# Patient Record
Sex: Male | Born: 1995 | Race: White | Hispanic: No | Marital: Single | Smoking: Never smoker
Health system: Southern US, Community
[De-identification: ages and names within clinical notes are randomized; demographics above are authoritative.]

## PROBLEM LIST (undated history)

## (undated) DIAGNOSIS — F909 Attention-deficit hyperactivity disorder, unspecified type: Secondary | ICD-10-CM

## (undated) DIAGNOSIS — J45909 Unspecified asthma, uncomplicated: Secondary | ICD-10-CM

## (undated) DIAGNOSIS — F319 Bipolar disorder, unspecified: Secondary | ICD-10-CM

## (undated) DIAGNOSIS — A4902 Methicillin resistant Staphylococcus aureus infection, unspecified site: Secondary | ICD-10-CM

## (undated) DIAGNOSIS — F419 Anxiety disorder, unspecified: Secondary | ICD-10-CM

## (undated) DIAGNOSIS — T7840XA Allergy, unspecified, initial encounter: Secondary | ICD-10-CM

## (undated) DIAGNOSIS — R079 Chest pain, unspecified: Secondary | ICD-10-CM

## (undated) HISTORY — PX: TYMPANOSTOMY TUBE PLACEMENT: SHX32

## (undated) HISTORY — DX: Unspecified asthma, uncomplicated: J45.909

## (undated) HISTORY — DX: Methicillin resistant Staphylococcus aureus infection, unspecified site: A49.02

## (undated) HISTORY — DX: Allergy, unspecified, initial encounter: T78.40XA

## (undated) HISTORY — DX: Chest pain, unspecified: R07.9

## (undated) HISTORY — DX: Anxiety disorder, unspecified: F41.9

## (undated) HISTORY — PX: ADENOIDECTOMY: SUR15

## (undated) HISTORY — DX: Attention-deficit hyperactivity disorder, unspecified type: F90.9

---
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
## 2013-10-22 ENCOUNTER — Emergency Department (HOSPITAL_COMMUNITY)
Admission: EM | Admit: 2013-10-22 | Discharge: 2013-10-22 | Disposition: A | Discharge status: Home / Self Care | Payer: 59 | Attending: Emergency Medicine | Admitting: Emergency Medicine

## 2013-10-22 ENCOUNTER — Emergency Department (HOSPITAL_COMMUNITY): Payer: 59

## 2013-10-22 ENCOUNTER — Encounter (HOSPITAL_COMMUNITY): Payer: Self-pay | Admitting: Emergency Medicine

## 2013-10-22 DIAGNOSIS — Z8659 Personal history of other mental and behavioral disorders: Secondary | ICD-10-CM | POA: Insufficient documentation

## 2013-10-22 DIAGNOSIS — R079 Chest pain, unspecified: Secondary | ICD-10-CM

## 2013-10-22 DIAGNOSIS — R000 Tachycardia, unspecified: Secondary | ICD-10-CM | POA: Insufficient documentation

## 2013-10-22 HISTORY — DX: Bipolar disorder, unspecified: F31.9

## 2013-10-22 LAB — CBC
HEMATOCRIT: 42.6 % (ref 39.0–52.0)
Hemoglobin: 13.8 g/dL (ref 13.0–17.0)
MCH: 31.4 pg (ref 26.0–34.0)
MCHC: 32.4 g/dL (ref 30.0–36.0)
MCV: 97 fL (ref 78.0–100.0)
PLATELETS: 254 10*3/uL (ref 150–400)
RBC: 4.39 MIL/uL (ref 4.22–5.81)
RDW: 13.1 % (ref 11.5–15.5)
WBC: 9.6 10*3/uL (ref 4.0–10.5)

## 2013-10-22 LAB — BASIC METABOLIC PANEL
BUN: 17 mg/dL (ref 6–23)
CHLORIDE: 103 meq/L (ref 96–112)
CO2: 26 meq/L (ref 19–32)
CREATININE: 1.2 mg/dL (ref 0.50–1.35)
Calcium: 10.3 mg/dL (ref 8.4–10.5)
GFR calc non Af Amer: 87 mL/min — ABNORMAL LOW (ref >90)
Glucose, Bld: 89 mg/dL (ref 70–99)
Potassium: 3.9 mEq/L (ref 3.7–5.3)
SODIUM: 141 meq/L (ref 137–147)

## 2013-10-22 LAB — I-STAT TROPONIN, ED
TROPONIN I, POC: 0 ng/mL (ref 0.00–0.08)
Troponin i, poc: 0.01 ng/mL (ref 0.00–0.08)

## 2013-10-22 MED ORDER — IBUPROFEN 800 MG PO TABS
800.0000 mg | ORAL_TABLET | Freq: Once | ORAL | Status: AC
  Administered 2013-10-22: 800 mg via ORAL
  Filled 2013-10-22: qty 1

## 2013-10-22 MED ORDER — IBUPROFEN 800 MG PO TABS
800.0000 mg | ORAL_TABLET | Freq: Once | ORAL | Status: DC
  Filled 2013-10-22: qty 1

## 2013-10-22 NOTE — Discharge Instructions (Signed)
Chest Pain (Nonspecific) °It is often hard to give a specific diagnosis for the cause of chest pain. There is always a chance that your pain could be related to something serious, such as a heart attack or a blood clot in the lungs. You need to follow up with your caregiver for further evaluation. °CAUSES  °· Heartburn. °· Pneumonia or bronchitis. °· Anxiety or stress. °· Inflammation around your heart (pericarditis) or lung (pleuritis or pleurisy). °· A blood clot in the lung. °· A collapsed lung (pneumothorax). It can develop suddenly on its own (spontaneous pneumothorax) or from injury (trauma) to the chest. °· Shingles infection (herpes zoster virus). °The chest wall is composed of bones, muscles, and cartilage. Any of these can be the source of the pain. °· The bones can be bruised by injury. °· The muscles or cartilage can be strained by coughing or overwork. °· The cartilage can be affected by inflammation and become sore (costochondritis). °DIAGNOSIS  °Lab tests or other studies, such as X-rays, electrocardiography, stress testing, or cardiac imaging, may be needed to find the cause of your pain.  °TREATMENT  °· Treatment depends on what may be causing your chest pain. Treatment may include: °· Acid blockers for heartburn. °· Anti-inflammatory medicine. °· Pain medicine for inflammatory conditions. °· Antibiotics if an infection is present. °· You may be advised to change lifestyle habits. This includes stopping smoking and avoiding alcohol, caffeine, and chocolate. °· You may be advised to keep your head raised (elevated) when sleeping. This reduces the chance of acid going backward from your stomach into your esophagus. °· Most of the time, nonspecific chest pain will improve within 2 to 3 days with rest and mild pain medicine. °HOME CARE INSTRUCTIONS  °· If antibiotics were prescribed, take your antibiotics as directed. Finish them even if you start to feel better. °· For the next few days, avoid physical  activities that bring on chest pain. Continue physical activities as directed. °· Do not smoke. °· Avoid drinking alcohol. °· Only take over-the-counter or prescription medicine for pain, discomfort, or fever as directed by your caregiver. °· Follow your caregiver's suggestions for further testing if your chest pain does not go away. °· Keep any follow-up appointments you made. If you do not go to an appointment, you could develop lasting (chronic) problems with pain. If there is any problem keeping an appointment, you must call to reschedule. °SEEK MEDICAL CARE IF:  °· You think you are having problems from the medicine you are taking. Read your medicine instructions carefully. °· Your chest pain does not go away, even after treatment. °· You develop a rash with blisters on your chest. °SEEK IMMEDIATE MEDICAL CARE IF:  °· You have increased chest pain or pain that spreads to your arm, neck, jaw, back, or abdomen. °· You develop shortness of breath, an increasing cough, or you are coughing up blood. °· You have severe back or abdominal pain, feel nauseous, or vomit. °· You develop severe weakness, fainting, or chills. °· You have a fever. °THIS IS AN EMERGENCY. Do not wait to see if the pain will go away. Get medical help at once. Call your local emergency services (911 in [place] Do not drive yourself to the hospital. °MAKE SURE YOU:  °· Understand these instructions. °· Will watch your condition. °· Will get help right away if you are not doing well or get worse. °Document Released: 03/28/2005 Document Revised: 09/10/2011 Document Reviewed: 01/22/2008 °ExitCare® Patient Information ©2014 [employer]   [employer]. ° °

## 2013-10-22 NOTE — ED Provider Notes (Signed)
CSN: [id number]     Arrival date & time 10/22/13  1746 History   First MD Initiated Contact with Patient 10/22/13 2118     Chief Complaint  Patient presents with  . Tachycardia  . Chest Pain     (Consider location/radiation/quality/duration/timing/severity/associated sxs/prior Treatment) Patient is a 18 y.o. male presenting with chest pain. The history is provided by the patient.  Chest Pain Pain location:  L lateral chest Pain quality: pressure   Pain radiates to:  Does not radiate Pain radiates to the back: no   Pain severity:  Moderate Onset quality:  Gradual Timing:  Intermittent Progression:  Worsening Chronicity:  New Context comment:  Running Relieved by:  Rest Worsened by:  Nothing tried Ineffective treatments:  None tried Associated symptoms: no abdominal pain, no cough, no fever, no shortness of breath and not vomiting     Past Medical History  Diagnosis Date  . Bipolar 1 disorder    Past Surgical History  Procedure Laterality Date  . Knee surgery Right    History reviewed. No pertinent family history. History  Substance Use Topics  . Smoking status: Never Smoker   . Smokeless tobacco: Not on file  . Alcohol Use: No    Review of Systems  Constitutional: Negative for fever.  Respiratory: Negative for cough and shortness of breath.   Cardiovascular: Positive for chest pain.  Gastrointestinal: Negative for vomiting and abdominal pain.  All other systems reviewed and are negative.     Allergies  Review of patient's allergies indicates no known allergies.  Home Medications   Prior to Admission medications   Not on File   BP 137/47  Pulse 60  Temp(Src) 98.1 F (36.7 C) (Oral)  Resp 15  SpO2 100% Physical Exam  Nursing note and vitals reviewed. Constitutional: He is oriented to person, place, and time. He appears well-developed and well-nourished. No distress.  HENT:  Head: Normocephalic and atraumatic.  Mouth/Throat: No oropharyngeal  exudate.  Eyes: EOM are normal. Pupils are equal, round, and reactive to light.  Neck: Normal range of motion. Neck supple.  Cardiovascular: Normal rate and regular rhythm.  Exam reveals no friction rub.   No murmur heard. Pulmonary/Chest: Effort normal and breath sounds normal. No respiratory distress. He has no wheezes. He has no rales. He exhibits no tenderness.  Abdominal: Soft. He exhibits no distension. There is no tenderness. There is no rebound.  Musculoskeletal: Normal range of motion. He exhibits no edema.  Neurological: He is alert and oriented to person, place, and time. No cranial nerve deficit. He exhibits normal muscle tone. Coordination normal.  Skin: No rash noted. He is not diaphoretic.    ED Course  Procedures (including critical care time) Labs Review Labs Reviewed  BASIC METABOLIC PANEL - Abnormal; Notable for the following:    GFR calc non Af Amer 87 (*)    All other components within normal limits  CBC  I-STAT TROPOININ, ED  Rosezena SensorI-STAT TROPOININ, ED    Imaging Review Dg Chest 2 View  10/22/2013   CLINICAL DATA:  Tachycardia.  Chest pain.  EXAM: CHEST  2 VIEW  COMPARISON:  None.  FINDINGS: The heart size and mediastinal contours are within normal limits. Both lungs are clear. The visualized skeletal structures are unremarkable.  IMPRESSION: No active cardiopulmonary disease.   Electronically Signed   By: Maisie Fushomas  Register   On: 10/22/2013 20:25     EKG Interpretation   Date/Time:  Thursday October 22 2013 17:54:20 EDT Ventricular Rate:  91 PR Interval:  158 QRS Duration: 86 QT Interval:  342 QTC Calculation: 420 R Axis:   80 Text Interpretation:  Normal sinus rhythm Normal ECG Similar to prior  Confirmed by Gwendolyn GrantWALDEN  MD, Nagi Furio ([id number]) on 10/22/2013 9:18:37 PM      MDM   Final diagnoses:  Chest pain    64M with no prior hx presents with chest pain. Ongoing for months, worse today while running on the treadmill. Patient had HR at that time in the 210s per  the treadmill. Elevated BP and HR at multiple other checks during the day.  Today, pinpoint pain, 2 places, L lateral chest. No radiation. No SOB, nausea, vomiting, diarrhea. No family history of early cardiac disease. Here, vitals stable, lungs clear, EKG ok, no signs of brugada, intervals normal, no signs of ischemia.  Serial troponins normal, CXR normal.  Doubt HR actually in 210s - as came from treadmill heart rate monitor. With young age and possibility of elevated HR, will have him f/u with Cards. I spoke with the fellow, who will arrange f/u. Stable for discharge.  Dagmar HaitWilliam Blayne Frankie, MD 10/23/13 [phone]

## 2013-10-22 NOTE — ED Notes (Addendum)
Pt reports on treadmill and noted HR to be 220. States "it is normally in the 190's when I work out. And my BP has been high." Pt also reports left sided chest pressure with exercise. Denies at this time. Also reports increased SOB with ambulation x "several months." Reports hx bipolar, states "I just get myself worked up over this kind of stuff." Pt sent here by baseball physical therapist for check up.

## 2013-11-19 ENCOUNTER — Encounter: Payer: Self-pay | Admitting: Cardiovascular Disease

## 2013-11-19 ENCOUNTER — Encounter: Payer: Self-pay | Admitting: *Deleted

## 2013-11-19 DIAGNOSIS — R079 Chest pain, unspecified: Secondary | ICD-10-CM | POA: Insufficient documentation

## 2013-11-20 ENCOUNTER — Encounter: Payer: Self-pay | Admitting: Cardiovascular Disease

## 2013-11-20 ENCOUNTER — Ambulatory Visit (INDEPENDENT_AMBULATORY_CARE_PROVIDER_SITE_OTHER): Payer: 59 | Admitting: Cardiovascular Disease

## 2013-11-20 VITALS — BP 132/80 | HR 61 | Ht 69.0 in | Wt 180.0 lb

## 2013-11-20 DIAGNOSIS — R079 Chest pain, unspecified: Secondary | ICD-10-CM

## 2013-11-20 DIAGNOSIS — R0989 Other specified symptoms and signs involving the circulatory and respiratory systems: Secondary | ICD-10-CM

## 2013-11-20 DIAGNOSIS — R0600 Dyspnea, unspecified: Secondary | ICD-10-CM

## 2013-11-20 DIAGNOSIS — R0609 Other forms of dyspnea: Secondary | ICD-10-CM

## 2013-11-20 MED ORDER — ALBUTEROL SULFATE HFA 108 (90 BASE) MCG/ACT IN AERS: 2.0000 | INHALATION_SPRAY | Freq: Four times a day (QID) | RESPIRATORY_TRACT | Status: DC | PRN

## 2013-11-20 NOTE — Assessment & Plan Note (Signed)
Atypical  Persistent post ER visit  Normal exam and ECG  F/U stress echo given dyspnea as well

## 2013-11-20 NOTE — Progress Notes (Signed)
Patient ID: Thomas Burton, male   DOB: Jan 28, 1996, 18 y.o.   MRN: [id number]  18 yo referred from ER for chest pain Seen there 4/23 with negative w/u  Normal ECG (reviewed)  Normal enzymes  CXR with NAD Pain on left side of chest Sharp partially reproducible pain.  He has kyphosis and has been seeing rehab at [facility].  Pitches at [facility] and has some shoulder weakness Also has some exertional dyspnea.  No history of asthma but breathing worse with exertion  Non smoker but a lot of 2nd hand smoke.  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  Still getting pains in chest Not pleuritic  No couging sputum or fever.     ROS: Denies fever, malais, weight loss, blurry vision, decreased visual acuity, cough, sputum, SOB, hemoptysis, pleuritic pain, palpitaitons, heartburn, abdominal pain, melena, lower extremity edema, claudication, or rash.  All other systems reviewed and negative   General: Affect appropriate Healthy:  appears stated age  Some upper lumbar kyphosis  HEENT: normal Neck supple with no adenopathy JVP normal no bruits no thyromegaly Lungs clear with no wheezing and good diaphragmatic motion Heart:  S1/S2 no murmur,rub, gallop or click PMI normal Abdomen: benighn, BS positve, no tenderness, no AAA no bruit.  No HSM or HJR Distal pulses intact with no bruits No edema Neuro non-focal Skin warm and dry No muscular weakness  Medications No current outpatient prescriptions on file.   No current facility-administered medications for this visit.    Allergies Review of patient's allergies indicates not on file.  Family History: No family history on file.  Social History: History   Social History  . Marital Status: Married    Spouse Name: N/A    Number of Children: N/A  . Years of Education: N/A   Occupational History  . Not on file.   Social History Main Topics  . Smoking status: Never Smoker   . Smokeless tobacco: Not on file    . Alcohol Use: No  . Drug Use: No  . Sexual Activity: Not on file   Other Topics Concern  . Not on file   Social History Narrative  . No narrative on file    Electrocardiogram:  NSR normal ECG   Assessment and Plan

## 2013-11-20 NOTE — Patient Instructions (Signed)
Your physician recommends that you schedule a follow-up appointment in:  AS NEEDED  Your physician has recommended you make the following change in your medication: ADD ALBUTEROL INHALER   AS  NEEDED Your physician has requested that you have a stress echocardiogram. For further information please visit [email or web address] Please follow instruction sheet as given. Your physician has recommended that you have a pulmonary function test. Pulmonary Function Tests are a group of tests that measure how well air moves in and out of your lungs.

## 2013-11-20 NOTE — Assessment & Plan Note (Signed)
CXR and exam ok  May have exercise induced asthma  F/U PFTls  Trial of Albuterol  F/U primary

## 2013-11-24 ENCOUNTER — Telehealth: Payer: Self-pay | Admitting: Cardiovascular Disease

## 2013-11-24 NOTE — Telephone Encounter (Signed)
New Message  Pt was recently seen and was given clearance to resume regular sports activities. Pt request to have a written clearance. Please call back to discuss//SR

## 2013-11-24 NOTE — Telephone Encounter (Signed)
LMTCB ./CY 

## 2013-11-25 NOTE — Telephone Encounter (Signed)
Follow up ° ° ° ° ° °Returning Thomas Burton's call °

## 2013-11-26 ENCOUNTER — Encounter: Payer: Self-pay | Admitting: *Deleted

## 2013-11-26 NOTE — Telephone Encounter (Signed)
Spoke with pt mother, they had caught dr Eden Emms in the hallway after the appt and he had given the pt permission to go ahead and exercise prior to him having the stress test. The coach needs a note. Will forward for dr Eden Emms review. Not dictated in office note. Will contact them once note complete.

## 2013-11-26 NOTE — Telephone Encounter (Signed)
Ok to write note for him to exercise

## 2013-11-26 NOTE — Telephone Encounter (Signed)
Spoke with pt mother, letter generated and placed at the front desk for pick up

## 2013-11-26 NOTE — Telephone Encounter (Signed)
Follow up     Returning Thomas Burton's call regarding son

## 2013-12-15 ENCOUNTER — Other Ambulatory Visit (HOSPITAL_COMMUNITY): Payer: 59

## 2013-12-23 ENCOUNTER — Ambulatory Visit (INDEPENDENT_AMBULATORY_CARE_PROVIDER_SITE_OTHER): Payer: 59 | Admitting: Internal Medicine

## 2013-12-23 ENCOUNTER — Encounter (INDEPENDENT_AMBULATORY_CARE_PROVIDER_SITE_OTHER): Payer: Self-pay

## 2013-12-23 DIAGNOSIS — R079 Chest pain, unspecified: Secondary | ICD-10-CM

## 2013-12-23 DIAGNOSIS — R0600 Dyspnea, unspecified: Secondary | ICD-10-CM

## 2013-12-23 DIAGNOSIS — R0989 Other specified symptoms and signs involving the circulatory and respiratory systems: Secondary | ICD-10-CM

## 2013-12-23 DIAGNOSIS — R0609 Other forms of dyspnea: Secondary | ICD-10-CM

## 2013-12-23 LAB — PULMONARY FUNCTION TEST
DL/VA % pred: 124 %
DL/VA: 5.7 ml/min/mmHg/L
DLCO unc % pred: 126 %
DLCO unc: 36.68 ml/min/mmHg
FEF 25-75 Post: 4.98 L/sec
FEF 25-75 Pre: 3.94 L/sec
FEF2575-%CHANGE-POST: 26 %
FEF2575-%PRED-POST: 105 %
FEF2575-%Pred-Pre: 83 %
FEV1-%CHANGE-POST: 10 %
FEV1-%PRED-POST: 109 %
FEV1-%PRED-PRE: 98 %
FEV1-PRE: 4.28 L
FEV1-Post: 4.74 L
FEV1FVC-%CHANGE-POST: 5 %
FEV1FVC-%PRED-PRE: 93 %
FEV6-%Change-Post: 4 %
FEV6-%Pred-Post: 110 %
FEV6-%Pred-Pre: 105 %
FEV6-PRE: 5.45 L
FEV6-Post: 5.71 L
FEV6FVC-%Change-Post: 0 %
FEV6FVC-%PRED-PRE: 99 %
FEV6FVC-%Pred-Post: 99 %
FVC-%Change-Post: 4 %
FVC-%Pred-Post: 110 %
FVC-%Pred-Pre: 105 %
FVC-POST: 5.71 L
FVC-Pre: 5.46 L
POST FEV1/FVC RATIO: 83 %
POST FEV6/FVC RATIO: 100 %
PRE FEV6/FVC RATIO: 100 %
Pre FEV1/FVC ratio: 79 %
RV % PRED: 124 %
RV: 1.58 L
TLC % PRED: 106 %
TLC: 6.75 L

## 2013-12-23 NOTE — Progress Notes (Signed)
PFT done today. 

## 2014-02-14 ENCOUNTER — Ambulatory Visit (INDEPENDENT_AMBULATORY_CARE_PROVIDER_SITE_OTHER): Payer: 59 | Admitting: Family Medicine

## 2014-02-14 VITALS — BP 136/82 | HR 72 | Temp 97.9°F | Resp 16 | Ht 69.5 in | Wt 175.2 lb

## 2014-02-14 DIAGNOSIS — Z113 Encounter for screening for infections with a predominantly sexual mode of transmission: Secondary | ICD-10-CM

## 2014-02-14 DIAGNOSIS — Z23 Encounter for immunization: Secondary | ICD-10-CM

## 2014-02-14 DIAGNOSIS — Z0000 Encounter for general adult medical examination without abnormal findings: Secondary | ICD-10-CM

## 2014-02-14 LAB — POCT URINALYSIS DIPSTICK
BILIRUBIN UA: NEGATIVE
Blood, UA: NEGATIVE
GLUCOSE UA: NEGATIVE
KETONES UA: NEGATIVE
Leukocytes, UA: NEGATIVE
Nitrite, UA: NEGATIVE
Protein, UA: NEGATIVE
SPEC GRAV UA: 1.02
UROBILINOGEN UA: 0.2
pH, UA: 6
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
## 2014-02-14 NOTE — Patient Instructions (Addendum)
1. RETURN IN TWO MONTHS FOR GARDISIL/HPV#2 2.  RETURN IN SIX MONTHS FOR HEPATITIS A#2 AND GARDISIL #3.   Meningococcal Vaccines: What You Need to Know 1. What is meningococcal disease? Meningococcal disease is a serious bacterial illness. It is a leading cause of bacterial meningitis in children 2 through 20 years old in the [place]. Meningitis is an infection of the covering of the brain and the spinal cord. Meningococcal disease also causes blood infections. About 1,000-1,200 people get meningococcal disease each year in the [place] Even when they are treated with antibiotics, 10-15% of these people die. Of those who live, another 11%-19% lose their arms or legs, have problems with their nervous systems, become deaf, or suffer seizures or strokes. Anyone can get meningococcal disease. But it is most common in infants less than one year of age and people 16-21 years. Children with certain medical conditions, such as lack of a spleen, have an increased risk of getting meningococcal disease. College freshmen living in dorms are also at increased risk. Meningococcal infections can be treated with drugs such as penicillin. Still, many people who get the disease die from it, and many others are affected for life. This is why preventing the disease through use of meningococcal vaccine is important for people at highest risk. 2. Meningococcal vaccine There are two kinds of meningococcal vaccine in the [place]  Meningococcal conjugate vaccine (MCV4) is the preferred vaccine for people 67 years of age and younger.  Meningococcal polysaccharide vaccine (MPSV4) has been available since the 1970s. It is the only meningococcal vaccine licensed for people older than 55. Both vaccines can prevent 4 types of meningococcal disease, including 2 of the 3 types most common in the [place] and a type that causes epidemics in [place]. There are other types of meningococcal disease; the vaccines do not protect  against these.  3. Who should get meningococcal vaccine and when? Routine vaccination Two doses of MCV4 are recommended for adolescents 11 through 20 years of age: the first dose at 37 or 20 years of age, with a booster dose at age 78. Adolescents in this age group with HIV infection should get 3 doses: 2 doses 2 months apart at 21 or 12 years, plus a booster at age 52. If the first dose (or series) is given between 101 and 70 years of age, the booster should be given between 27 and 21. If the first dose (or series) is given after the 16th birthday, a booster is not needed. Other people at increased risk  College freshmen living in dormitories.  Laboratory personnel who are routinely exposed to meningococcal bacteria.  [employer] recruits.  Anyone traveling to, or living in, a part of the world where meningococcal disease is common, such as parts of [place].  Anyone who has a damaged spleen, or whose spleen has been removed.  Anyone who has persistent complement component deficiency (an immune system disorder).  People who might have been exposed to meningitis during an outbreak. Children between 26 and 41 months of age, and anyone else with certain medical conditions need 2 doses for adequate protection. Ask your doctor about the number and timing of doses, and the need for booster doses. MCV4 is the preferred vaccine for people in these groups who are 9 months through 20 years of age. MPSV4 can be used for adults older than 55. 4. Some people should not get meningococcal vaccine or should wait.  Anyone who has ever had a severe (life-threatening) allergic  reaction to a previous dose of MCV4 or MPSV4 vaccine should not get another dose of either vaccine.  Anyone who has a severe (life threatening) allergy to any vaccine component should not get the vaccine. Tell your doctor if you have any severe allergies.  Anyone who is moderately or severely ill at the time the shot is scheduled  should probably wait until they recover. Ask your doctor. People with a mild illness can usually get the vaccine.  Meningococcal vaccines may be given to pregnant women. MCV4 is a fairly new vaccine and has not been studied in pregnant women as much as MPSV4 has. It should be used only if clearly needed. The manufacturers of MCV4 maintain pregnancy registries for women who are vaccinated while pregnant. Except for children with sickle cell disease or without a working spleen, meningococcal vaccines may be given at the same time as other vaccines. 5. What are the risks from meningococcal vaccines? A vaccine, like any medicine, could possibly cause serious problems, such as severe allergic reactions. The risk of meningococcal vaccine causing serious harm, or death, is extremely small. Brief fainting spells and related symptoms (such as jerking or seizure-like movements) can follow a vaccination. They happen most often with adolescents, and they can result in falls and injuries. Sitting or lying down for about 15 minutes after getting the shot--especially if you feel faint--can help prevent these injuries. Mild problems As many as half the people who get meningococcal vaccines have mild side effects, such as redness or pain where the shot was given. If these problems occur, they usually last for 1 or 2 days. They are more common after MCV4 than after MPSV4. A small percentage of people who receive the vaccine develop a mild fever. Severe problems Serious allergic reactions, within a few minutes to a few hours of the shot, are very rare. 6. What if there is a serious reaction? What should I look for? Look for anything that concerns you, such as signs of a severe allergic reaction, very high fever, or behavior changes. Signs of a severe allergic reaction can include hives, swelling of the face and throat, difficulty breathing, a fast heartbeat, dizziness, and weakness. These would start a few minutes to  a few hours after the vaccination. What should I do?  If you think it is a severe allergic reaction or other emergency that can't wait, call 9-1-1 or get the person to the nearest hospital. Otherwise, call your doctor.  Afterward, the reaction should be reported to the Vaccine Adverse Event Reporting System (VAERS). Your doctor might file this report, or you can do it yourself through the VAERS web site at www.vaers.LAgents.no, or by calling 1-564 657 9667. VAERS is only for reporting reactions. They do not give medical advice. 7. The National Vaccine Injury Compensation Program The [employer] Vaccine Injury Compensation Program (VICP) is a federal program that was created to compensate people who may have been injured by certain vaccines. Persons who believe they may have been injured by a vaccine can learn about the program and about filing a claim by calling 1-684 009 2407 or visiting the VICP website at SpiritualWord.at. 8. How can I learn more?  Ask your doctor.  Call your local or state health department.  Contact the Centers for Disease Control and Prevention (CDC):  Call 612-326-5228 (1-800-CDC-INFO) or  Visit the CDC's website at PicCapture.uy CDC Meningococcal Vaccine (Interim) VIS (04/14/2010) Document Released: 04/15/2006 Document Revised: 11/02/2013 Document Reviewed: 10/08/2012 [facility] Patient Information 2015 [place], [place]. This information is  not intended to replace advice given to you by your health care provider. Make sure you discuss any questions you have with your health care provider.   HPV Vaccine Gardasil (Human Papillomavirus): What You Need to Know 1. What is HPV? Genital human papillomavirus (HPV) is the most common sexually transmitted virus in the [place]. More than half of sexually active men and women are infected with HPV at some time in their lives. About 20 million Americans are currently infected, and about 6 million more  get infected each year. HPV is usually spread through sexual contact. Most HPV infections don't cause any symptoms, and go away on their own. But HPV can cause cervical cancer in women. Cervical cancer is the 2nd leading cause of cancer deaths among women around the world. In the [place], about 12,000 women get cervical cancer every year and about 4,000 are expected to die from it. HPV is also associated with several less common cancers, such as vaginal and vulvar cancers in women, and anal and oropharyngeal (back of the throat, including base of tongue and tonsils) cancers in both men and women. HPV can also cause genital warts and warts in the throat. There is no cure for HPV infection, but some of the problems it causes can be treated. 2. HPV vaccine: Why get vaccinated? The HPV vaccine you are getting is one of two vaccines that can be given to prevent HPV. It may be given to both males and females.  This vaccine can prevent most cases of cervical cancer in females, if it is given before exposure to the virus. In addition, it can prevent vaginal and vulvar cancer in females, and genital warts and anal cancer in both males and females. Protection from HPV vaccine is expected to be long-lasting. But vaccination is not a substitute for cervical cancer screening. Women should still get regular Pap tests. 3. Who should get this HPV vaccine and when? HPV vaccine is given as a 3-dose series  1st Dose: Now  2nd Dose: 1 to 2 months after Dose 1  3rd Dose: 6 months after Dose 1 Additional (booster) doses are not recommended. Routine vaccination  This HPV vaccine is recommended for girls and boys 21 or 20 years of age. It may be given starting at age 6. Why is HPV vaccine recommended at 56 or 20 years of age?  HPV infection is easily acquired, even with only one sex partner. That is why it is important to get HPV vaccine before any sexual contact takes place. Also, response to the vaccine is  better at this age than at older ages. Catch-up vaccination This vaccine is recommended for the following people who have not completed the 3-dose series:   Females 13 through 20 years of age.  Males 13 through 20 years of age. This vaccine may be given to men 22 through 19 years of age who have not completed the 3-dose series. It is recommended for men through age 42 who have sex with men or whose immune system is weakened because of HIV infection, other illness, or medications.  HPV vaccine may be given at the same time as other vaccines. 4. Some people should not get HPV vaccine or should wait.  Anyone who has ever had a life-threatening allergic reaction to any component of HPV vaccine, or to a previous dose of HPV vaccine, should not get the vaccine. Tell your doctor if the person getting vaccinated has any severe allergies, including an allergy to  yeast.  HPV vaccine is not recommended for pregnant women. However, receiving HPV vaccine when pregnant is not a reason to consider terminating the pregnancy. Women who are breast feeding may get the vaccine.  People who are mildly ill when a dose of HPV is planned can still be vaccinated. People with a moderate or severe illness should wait until they are better. 5. What are the risks from this vaccine? This HPV vaccine has been used in the [place] and around the world for about six years and has been very safe. However, any medicine could possibly cause a serious problem, such as a severe allergic reaction. The risk of any vaccine causing a serious injury, or death, is extremely small. Life-threatening allergic reactions from vaccines are very rare. If they do occur, it would be within a few minutes to a few hours after the vaccination. Several mild to moderate problems are known to occur with this HPV vaccine. These do not last long and go away on their own.  Reactions in the arm where the shot was given:  Pain (about 8 people in  10)  Redness or swelling (about 1 person in 4)  Fever:  Mild (100 F) (about 1 person in 10)  Moderate (102 F) (about 1 person in 41)  Other problems:  Headache (about 1 person in 3)  Fainting: Brief fainting spells and related symptoms (such as jerking movements) can happen after any medical procedure, including vaccination. Sitting or lying down for about 15 minutes after a vaccination can help prevent fainting and injuries caused by falls. Tell your doctor if the patient feels dizzy or light-headed, or has vision changes or ringing in the ears.  Like all vaccines, HPV vaccines will continue to be monitored for unusual or severe problems. 6. What if there is a serious reaction? What should I look for?  Look for anything that concerns you, such as signs of a severe allergic reaction, very high fever, or behavior changes. Signs of a severe allergic reaction can include hives, swelling of the face and throat, difficulty breathing, a fast heartbeat, dizziness, and weakness. These would start a few minutes to a few hours after the vaccination.  What should I do?  If you think it is a severe allergic reaction or other emergency that can't wait, call 9-1-1 or get the person to the nearest hospital. Otherwise, call your doctor.  Afterward, the reaction should be reported to the Vaccine Adverse Event Reporting System (VAERS). Your doctor might file this report, or you can do it yourself through the VAERS web site at www.vaers.LAgents.no, or by calling 1-978-375-7117. VAERS is only for reporting reactions. They do not give medical advice. 7. The National Vaccine Injury Compensation Program  The [employer] Vaccine Injury Compensation Program (VICP) is a federal program that was created to compensate people who may have been injured by certain vaccines.  Persons who believe they may have been injured by a vaccine can learn about the program and about filing a claim by calling 1-(845) 664-3267 or  visiting the VICP website at SpiritualWord.at. 8. How can I learn more?  Ask your doctor.  Call your local or state health department.  Contact the Centers for Disease Control and Prevention (CDC):  Call 814-172-4335 (1-800-CDC-INFO)  or  Visit CDC's website at PicCapture.uy CDC Human Papillomavirus (HPV) Gardasil (Interim) 11/16/11 Document Released: 04/15/2006 Document Revised: 11/02/2013 Document Reviewed: 07/30/2013 ExitCare Patient Information 2015 [place], [place]. This information is not intended to replace advice given to you by  your health care provider. Make sure you discuss any questions you have with your health care provider.   Hepatitis A Vaccine: What You Need to Know 1. What is hepatitis A? Hepatitis A is a serious liver disease caused by the hepatitis A virus (HAV). HAV is found in the stool of people with hepatitis A. It is usually spread by close personal contact and sometimes by eating food or drinking water containing HAV. A person who has hepatitis A can easily pass the disease to others within the same household. Hepatitis A can cause:  "flu-like" illness  jaundice (yellow skin or eyes, dark urine)  severe stomach pains and diarrhea (children) People with hepatitis A often have to be hospitalized (up to about 1 person in 5). Adults with hepatitis A are often too ill to work for up to a month. Sometimes, people die as a result of hepatitis A (about 3-6 deaths per 1,000 cases). Hepatitis A vaccine can prevent hepatitis A. 2. Who should get hepatitis A vaccine and when? WHO Some people should be routinely vaccinated with hepatitis A vaccine:  All children between their first and second birthdays (1312 through 6423 months of age).  Anyone 1 year of age and older traveling to or working in countries with high or intermediate prevalence of hepatitis A, such as those located in [place] or [place], [place], [place]except [place], and  [place]. For more information see http://www.church.org/www.cdc.gov/travel.  Children and adolescents 2 through 20 years of age who live in states or communities where routine vaccination has been implemented because of high disease incidence.  Men who have sex with men.  People who use street drugs.  People with chronic liver disease.  People who are treated with clotting factor concentrates.  People who work with HAV-infected primates or who work with HAV in [employer] laboratories.  Members of households planning to adopt a child, or care for a newly arriving adopted child, from a country where hepatitis A is common. Other people might get hepatitis A vaccine in certain situations (ask your doctor for more details):  Unvaccinated children or adolescents in communities where outbreaks of hepatitis A are occurring.  Unvaccinated people who have been exposed to hepatitis A virus.  Anyone 1 year of age or older who wants protection from hepatitis A. Hepatitis A vaccine is not licensed for children younger than 1 year of age. WHEN For children, the first dose should be given at 8912 through 3923 months of age. Children who are not vaccinated by 192 years of age can be vaccinated at later visits. For others at risk, the hepatitis A vaccine series may be started whenever a person wishes to be protected or is at risk of infection. For travelers, it is best to start the vaccine series at least 1 month before traveling. (Some protection may still result if the vaccine is given on or closer to the travel date.) Some people who cannot get the vaccine before traveling, or for whom the vaccine might not be effective, can get a shot called immune globulin (IG). IG gives immediate, temporary protection. Two doses of the vaccine are needed for lasting protection. These doses should be given at least 6 months apart. Hepatitis A vaccine may be given at the same time as other vaccines. 3. Some people should not get hepatitis  A vaccine or should wait.  Anyone who has ever had a severe (life threatening) allergic reaction to a previous dose of hepatitis A vaccine should not  get another dose.  Anyone who has a severe (life threatening) allergy to any vaccine component should not get the vaccine.  Tell your doctor if you have any severe allergies, including a severe allergy to latex. All hepatitis A vaccines contain alum, and some hepatitis A vaccines contain 2-phenoxyethanol.  Anyone who is moderately or severely ill at the time the shot is scheduled should probably wait until they recover. Ask your doctor. People with a mild illness can usually get the vaccine.  Tell your doctor if you are pregnant. Because hepatitis A vaccine is inactivated (killed), the risk to a pregnant woman or her unborn baby is believed to be very low. But your doctor can weigh any theoretical risk from the vaccine against the need for protection. 4. What are the risks from hepatitis A vaccine? A vaccine, like any medicine, could possibly cause serious problems, such as severe allergic reactions. The risk of hepatitis A vaccine causing serious harm, or death, is extremely small. Getting hepatitis A vaccine is much safer than getting the disease. Mild problems  soreness where the shot was given (about 1 out of 2 adults, and up to 1 out of 6 children)  headache (about 1 out of 6 adults and 1 out of 25 children)  loss of appetite (about 1 out of 12 children)  tiredness (about 1 out of 14 adults) If these problems occur, they usually last 1 or 2 days. Severe problems  serious allergic reaction, within a few minutes to a few hours after the shot (very rare). 5. What if there is a serious reaction? What should I look for?  Look for anything that concerns you, such as signs of a severe allergic reaction, very high fever, or behavior changes. Signs of a severe allergic reaction can include hives, swelling of the face and throat, difficulty  breathing, a fast heartbeat, dizziness, and weakness. These would start a few minutes to a few hours after the vaccination. What should I do?  If you think it is a severe allergic reaction or other emergency that can't wait, call 9-1-1 or get the person to the nearest hospital. Otherwise, call your doctor.  Afterward, the reaction should be reported to the Vaccine Adverse Event Reporting System (VAERS). Your doctor might file this report, or you can do it yourself through the VAERS web site at www.vaers.LAgents.no, or by calling 1-6518613614. VAERS is only for reporting reactions. They do not give medical advice. 6. The National Vaccine Injury Compensation Program The [employer] Vaccine Injury Compensation Program (VICP) is a federal program that was created to compensate people who may have been injured by certain vaccines. Persons who believe they may have been injured by a vaccine can learn about the program and about filing a claim by calling 1-778-514-2351 or visiting the VICP website at SpiritualWord.at. 7. How can I learn more?  Ask your doctor.  Call your local or state health department.  Contact the Centers for Disease Control and Prevention (CDC):  Call 817-044-7273 (1-800-CDC-INFO) or  Visit CDC's website at: PicCapture.uy CDC Hepatitis A Vaccine VIS (Interim) (04/25/10)  Document Released: 04/12/2006 Document Revised: 11/02/2013 Document Reviewed: 07/30/2013 ExitCare Patient Information 2015 [place], [place]. This information is not intended to replace advice given to you by your health care provider. Make sure you discuss any questions you have with your health care provider.

## 2014-02-16 LAB — GC/CHLAMYDIA PROBE AMP
CT PROBE, AMP APTIMA: NEGATIVE
GC PROBE AMP APTIMA: NEGATIVE

## 2014-02-17 ENCOUNTER — Telehealth: Payer: Self-pay

## 2014-02-17 NOTE — Telephone Encounter (Signed)
Patients mother is returning call from lab

## 2014-02-19 NOTE — Telephone Encounter (Signed)
See labs. Unable to speak with mother.

## 2014-03-01 ENCOUNTER — Telehealth: Payer: Self-pay | Admitting: Family Medicine

## 2014-03-01 NOTE — Telephone Encounter (Signed)
Pt stated he will have his mother call back to make appt for 2nd Gardasil

## 2014-03-01 NOTE — Telephone Encounter (Signed)
Message copied by Tilman Neat on Mon Mar 01, 2014  4:22 PM ------      Message from: Ethelda Chick      Created: Sat Feb 27, 2014  1:45 PM       Please schedule OV with me in 2 months for Gardisil#2. ------

## 2015-02-15 ENCOUNTER — Ambulatory Visit (INDEPENDENT_AMBULATORY_CARE_PROVIDER_SITE_OTHER): Payer: Commercial Managed Care - HMO | Admitting: Family Medicine

## 2015-02-15 VITALS — BP 124/80 | HR 65 | Temp 98.0°F | Resp 17 | Ht 68.0 in | Wt 184.0 lb

## 2015-02-15 DIAGNOSIS — Z23 Encounter for immunization: Secondary | ICD-10-CM | POA: Diagnosis not present

## 2015-02-15 DIAGNOSIS — Z113 Encounter for screening for infections with a predominantly sexual mode of transmission: Secondary | ICD-10-CM | POA: Diagnosis not present

## 2015-02-15 DIAGNOSIS — Z0000 Encounter for general adult medical examination without abnormal findings: Secondary | ICD-10-CM

## 2015-02-15 NOTE — Patient Instructions (Signed)
Keeping you healthy  Get these tests  Blood pressure- Have your blood pressure checked once a year by your healthcare provider.  Normal blood pressure is 120/80.  Weight- Have your body mass index (BMI) calculated to screen for obesity.  BMI is a measure of body fat based on height and weight. You can also calculate your own BMI at https://www.west-esparza.com/.  Cholesterol- Have your cholesterol checked regularly starting at age 21, sooner may be necessary if you have diabetes, high blood pressure, if a family member developed heart diseases at an early age or if you smoke.   Chlamydia, HIV, and other sexual transmitted disease- Get screened each year until the age of 84 then within three months of each new sexual partner.  Diabetes- Have your blood sugar checked regularly if you have high blood pressure, high cholesterol, a family history of diabetes or if you are overweight.  Get these vaccines  Flu shot- Every fall.  Tetanus shot- Every 10 years.  Menactra- Single dose; prevents meningitis.  HPV: you are due for your 2nd of 3 HPV vaccinations.   Hep A: you are due for your 2nd and final Hep A  Take these steps  Don't smoke- If you do smoke, ask your healthcare provider about quitting. For tips on how to quit, go to www.smokefree.gov or call 1-800-QUIT-NOW.  Be physically active- Exercise 5 days a week for at least 30 minutes.  If you are not already physically active start slow and gradually work up to 30 minutes of moderate physical activity.  Examples of moderate activity include walking briskly, mowing the yard, dancing, swimming bicycling, etc.  Eat a healthy diet- Eat a variety of healthy foods such as fruits, vegetables, low fat milk, low fat cheese, yogurt, lean meats, poultry, fish, beans, tofu, etc.  For more information on healthy eating, go to www.thenutritionsource.org  Drink alcohol in moderation- Limit alcohol intake two drinks or less a day.  Never drink and  drive.  Dentist- Brush and floss teeth twice daily; visit your dentis twice a year.  Depression-Your emotional health is as important as your physical health.  If you're feeling down, losing interest in things you normally enjoy please talk with your healthcare provider.  Gun Safety- If you keep a gun in your home, keep it unloaded and with the safety lock on.  Bullets should be stored separately.  Helmet use- Always wear a helmet when riding a motorcycle, bicycle, rollerblading or skateboarding.  Safe sex- If you may be exposed to a sexually transmitted infection, use a condom  Seat belts- Seat bels can save your life; always wear one.  Smoke/Carbon Monoxide detectors- These detectors need to be installed on the appropriate level of your home.  Replace batteries at least once a year.  Skin Cancer- When out in the sun, cover up and use sunscreen SPF 15 or higher.  Violence- If anyone is threatening or hurting you, please tell your healthcare provider.

## 2015-02-15 NOTE — Progress Notes (Signed)
[facility] [address], [place] [address] [phone]  Date:  02/15/2015   Name:  Thomas Burton   DOB:  1995-09-27   MRN:  [id number]  PCP:  No PCP Per Patient    Chief Complaint: Annual Exam   History of Present Illness:  Thomas Burton is a 19 y.o. very pleasant male patient with a PMHx of Bipolar disorder and sports induced Asthma who presents for a complete physical examination and sports pre-participation clearance today. He is accompagnied by his girlfriend.   Last Physical: Sports physical 2015 Last Eye Exam: Never TDap: Before middle school HepA #1 last year as well as HPV #1.    He has been having increasing back tightness.  He is unable to throw overhead like he used to.  He reports difficulty sitting up straight.  He doesn't have an ATC or PT at school.    Pt was cleared by cardiology to participate in sports this last year following an episode of chest pain.  He was diagnosed with exercise induced asthma.  He was seen in the past by pulmonology.  He denies ever needing his albuterol in the last year.   Denies a history of STI's. No longer using condoms as he is in a long term relationship.  His girlfriend has an IUD. His STI screening was negative last year.   Again, pt currently lives with his parents in [place]. He is in school at [employer] with a degree in associates in Arts He hopes to plays Baseball professionally or be a Optometrist.  Pt denies any alcohol or drug use. No chewing tobacco use. However, he is exposed to 2nd hand smoke (Father).  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 He has a history of right arm injuries which sound most consistent with little leaguers elbow and shoulder. He has back tightness and been told he has excessive "slouching".    Pt wears a seatbelt while operating a moving vehicle. He does not think his father still has a gun in the home.   He denies any hospitalizations in the last year.   He  has been diagnosed with Bipolar disorder by Dr. Betti Cruz. Pt has been prescribed medications which he is not currently taking at this time. Pt states he is doing ok. He admits to mild anger outburst and sometimes fighting with his nephew. States his emotions are controlled at this time without medication.    Patient Active Problem List   Diagnosis Date Noted  . Dyspnea 11/20/2013  . Chest pain     Past Medical History  Diagnosis Date  . Bipolar 1 disorder   . Chest pain   . Asthma     Exercise induced asthma  . ADHD (attention deficit hyperactivity disorder)     Past Surgical History  Procedure Laterality Date  . Knee surgery Right 07/02/2008    MRSA; hospitalized for one week; [facility]    Social History  Substance Use Topics  . Smoking status: Never Smoker   . Smokeless tobacco: None  . Alcohol Use: No    Family History  Problem Relation Age of Onset  . Hypertension Mother     Allergies  Allergen Reactions  . Ritalin [Methylphenidate]     Anger    Medication list has been reviewed and updated.  Current Outpatient Prescriptions on File Prior to Visit  Medication Sig Dispense Refill  . albuterol (PROVENTIL HFA;VENTOLIN HFA) 108 (90  BASE) MCG/ACT inhaler Inhale 2 puffs into the lungs every 6 (six) hours as needed for wheezing or shortness of breath. 1 Inhaler 2   No current facility-administered medications on file prior to visit.    Review of Systems:  Review of Systems  Constitutional: Negative for fever, chills, weight loss, malaise/fatigue and diaphoresis.  Respiratory: Negative for cough, sputum production, shortness of breath and wheezing.   Cardiovascular: Negative for chest pain and palpitations.  Gastrointestinal: Negative for heartburn, nausea, vomiting, abdominal pain, diarrhea, constipation and blood in stool.  Genitourinary: Negative for dysuria, urgency, frequency, hematuria and flank pain.  Musculoskeletal: Positive for back pain (tightness).  Negative for myalgias and joint pain.  Skin: Negative for rash.  Neurological: Negative for dizziness, weakness and headaches.  Endo/Heme/Allergies: Does not bruise/bleed easily.  Psychiatric/Behavioral: Negative for depression.    Physical Examination: Filed Vitals:   02/15/15 1859  BP: 124/80  Pulse: 65  Temp: 98 F (36.7 C)  Resp: 17   Filed Vitals:   02/15/15 1859  Height: 5\' 8"  (1.727 m)  Weight: 184 lb (83.462 kg)   Body mass index is 27.98 kg/(m^2). Ideal Body Weight: Weight in (lb) to have BMI = 25: 164.1  GEN: WDWN, NAD, Non-toxic, A & O x 3 HEENT: Atraumatic, Normocephalic. Neck supple. No masses, No LAD. PERRL Ears and Nose: No external deformity. CV: RRR, No M/G/R. No JVD. No thrill. No extra heart sounds. No pulse delay from radial to femoral.  PULM: CTA B, no wheezes, crackles, rhonchi. No retractions. No resp. distress. No accessory muscle use. ABD: S, NT, ND, +BS. No rebound. No HSM. EXTR: No c/c/e NEURO Normal gait.  PSYCH: Normally interactive. Conversant. Not depressed or anxious appearing.  Calm demeanor.  Inspection, Strength and ROM checked in knees, hips, ankles, shoulder, wrist, and elbow and is WNL. Palpation of above joints was negative for pain or abnormalities. Flexibility is normal throughout. Normal gait including heel walking and toe walking and tandem walking. No sensory changes. No reflex changes. Back appears hyperkyphotic.  There appears to be scapulothroacic disfunction.  No scapular winging.    Assessment and Plan  1. Routine general medical exam:  - Completed physical form. No concerning findings.  Multiple issues in the past that were address at last year's HME.  Nothing new.   - Will obtain GC/chlam.  Asymptomatic. No history of STI.  Declined HIV - Declined HepA #2 and HPV #2 today.  Reports he will follow up in the future.  - age appropriate anticipatory guidance complete.   2. Hyperkyphosis & scapulothoracic dysfunction in a  college pitcher. No pain, but thoracic back tightness poor range of motion/scapulothoracic dysfunction on exam.   - Discussed various options for care.  - Recommended he seek out a sports medicine evaluation.  - Can see PT or chiropractor - Tried to ease concerns about being held out if he seeks evaluation.    Signed Guinevere Scarlet, MD

## 2015-02-17 LAB — GC/CHLAMYDIA PROBE AMP
CT PROBE, AMP APTIMA: NEGATIVE
GC PROBE AMP APTIMA: NEGATIVE

## 2015-02-18 NOTE — Progress Notes (Signed)
Patient discussed and examined with Dr. Mayford Knife. Agree with his assessment and plan. If persistent back discomfort, would recommend physical therapy or evaluation with sports medicine/ortho.  Okay to try initial chiropractic treatment.

## 2016-01-31 ENCOUNTER — Ambulatory Visit (INDEPENDENT_AMBULATORY_CARE_PROVIDER_SITE_OTHER): Payer: Commercial Managed Care - HMO | Admitting: Physician Assistant

## 2016-01-31 ENCOUNTER — Ambulatory Visit (INDEPENDENT_AMBULATORY_CARE_PROVIDER_SITE_OTHER): Payer: Commercial Managed Care - HMO

## 2016-01-31 VITALS — BP 116/66 | HR 53 | Temp 98.0°F | Resp 16 | Ht 69.0 in | Wt 185.0 lb

## 2016-01-31 DIAGNOSIS — R112 Nausea with vomiting, unspecified: Secondary | ICD-10-CM

## 2016-01-31 DIAGNOSIS — IMO0001 Reserved for inherently not codable concepts without codable children: Secondary | ICD-10-CM

## 2016-01-31 DIAGNOSIS — R059 Cough, unspecified: Secondary | ICD-10-CM

## 2016-01-31 DIAGNOSIS — K219 Gastro-esophageal reflux disease without esophagitis: Secondary | ICD-10-CM

## 2016-01-31 DIAGNOSIS — R05 Cough: Secondary | ICD-10-CM

## 2016-01-31 DIAGNOSIS — R1110 Vomiting, unspecified: Secondary | ICD-10-CM

## 2016-01-31 LAB — POC MICROSCOPIC URINALYSIS (UMFC): Mucus: ABSENT

## 2016-01-31 LAB — POCT CBC
GRANULOCYTE PERCENT: 67.6 % (ref 37–80)
HCT, POC: 43.3 % — AB (ref 43.5–53.7)
Hemoglobin: 14.6 g/dL (ref 14.1–18.1)
Lymph, poc: 1.9 (ref 0.6–3.4)
MCH, POC: 31.3 pg — AB (ref 27–31.2)
MCHC: 33.6 g/dL (ref 31.8–35.4)
MCV: 92.9 fL (ref 80–97)
MID (CBC): 0.1 (ref 0–0.9)
MPV: 8.4 fL (ref 0–99.8)
PLATELET COUNT, POC: 229 10*3/uL (ref 142–424)
POC Granulocyte: 4.3 (ref 2–6.9)
POC LYMPH PERCENT: 30.5 %L (ref 10–50)
POC MID %: 1.9 % (ref 0–12)
RBC: 4.66 M/uL — AB (ref 4.69–6.13)
RDW, POC: 13.3 %
WBC: 6.3 10*3/uL (ref 4.6–10.2)

## 2016-01-31 MED ORDER — RANITIDINE HCL 150 MG PO TABS: 150.0000 mg | ORAL_TABLET | Freq: Two times a day (BID) | ORAL | 1 refills | Status: DC

## 2016-01-31 MED ORDER — OMEPRAZOLE 20 MG PO CPDR: 20.0000 mg | DELAYED_RELEASE_CAPSULE | Freq: Every day | ORAL | 1 refills | Status: DC

## 2016-01-31 NOTE — Progress Notes (Signed)
Patient ID: Thomas Burton, male   DOB: [birth year], 20 y.o.   MRN: [id number] [facility] [address], [place] [address] [phone]  Date:  01/31/2016   Name:  Thomas Burton   DOB:  1995/08/27   MRN:  [id number]  PCP:  No PCP Per Patient   By signing my name below, I, Essence Howell, attest that this documentation has been prepared under the direction and in the presence of Trena Platt, PA-C Electronically Signed: Charline Bills, ED Scribe 01/31/2016 at 6:20 PM.  History of Present Illness:  Thomas Burton is a 20 y.o. male patient, with a h/o exercise induced asthma, who presents to [facility] complaining of persistent heartburn for the past 1.5 weeks. Pt reports associated symptoms of abdominal distension, increased belching, sob and difficulty taking deep breaths, substernal chest tightness and productive cough with whit sputum. Pt states that he has been burping acid and food throughout the day which is worsened at night and with lying. Pt states that he eats a lot of fast food and tomato based food. He states that he consumes fast food at least x2-3 daily now, every day during the school year. He states that he has stopped drinking sodas but states that he typically drinks a 2 liter by himself. He states that he occasionally eats chocolate, [place] food and caffeinated tea. He has tried OTC Omeprazole without significant relief. He also reports intermittent dysuria onset 2 days ago and bilateral flank pain while practicing sports. Pt denies night terrors, nausea, blood in stools, melena, hematuria. Pt reports a family h/o acid reflux. He is a nonsmoker but states that he has used a vapor a few times. Pt had a normal EKG 2 summers ago.   Wt Readings from Last 3 Encounters:  01/31/16 185 lb (83.9 kg)  02/15/15 184 lb (83.5 kg)  02/14/14 175 lb 3.2 oz (79.5 kg) (76 %, Z= 0.70)*   * Growth percentiles are based on CDC 2-20 Years data.    Patient Active Problem List    Diagnosis Date Noted   Dyspnea 11/20/2013   Chest pain     Past Medical History:  Diagnosis Date   ADHD (attention deficit hyperactivity disorder)    Asthma    Exercise induced asthma   Bipolar 1 disorder (HCC)    Chest pain     Past Surgical History:  Procedure Laterality Date   KNEE SURGERY Right 07/02/2008   MRSA; hospitalized for one week; [facility]    Social History  Substance Use Topics   Smoking status: Never Smoker   Smokeless tobacco: Not on file   Alcohol use No    Family History  Problem Relation Age of Onset   Hypertension Mother     Allergies  Allergen Reactions   Ritalin [Methylphenidate]     Anger    Medication list has been reviewed and updated.  Current Outpatient Prescriptions on File Prior to Visit  Medication Sig Dispense Refill   albuterol (PROVENTIL HFA;VENTOLIN HFA) 108 (90 BASE) MCG/ACT inhaler Inhale 2 puffs into the lungs every 6 (six) hours as needed for wheezing or shortness of breath. (Patient not taking: Reported on 01/31/2016) 1 Inhaler 2   No current facility-administered medications on file prior to visit.     Review of Systems  Respiratory: Positive for cough, sputum production and shortness of breath.   Cardiovascular: Positive for chest pain.  Gastrointestinal: Positive for heartburn. Negative for blood in stool, melena and  nausea.  Genitourinary: Positive for dysuria and flank pain. Negative for hematuria.    Physical Examination: BP 116/66 (BP Location: Right Arm, Patient Position: Sitting, Cuff Size: Normal)    Pulse (!) 53    Temp 98 F (36.7 C)    Resp 16    Ht 5\' 9"  (1.753 m)    Wt 185 lb (83.9 kg)    SpO2 99%    BMI 27.32 kg/m  Ideal Body Weight: @FLOWAMB ([id number])@  Physical Exam  Constitutional: He is oriented to person, place, and time. He appears well-developed and well-nourished. No distress.  HENT:  Head: Atraumatic.  Right Ear: Tympanic membrane, external ear and ear canal normal.  Left  Ear: Tympanic membrane, external ear and ear canal normal.  Nose: Right sinus exhibits no maxillary sinus tenderness and no frontal sinus tenderness. Left sinus exhibits no maxillary sinus tenderness and no frontal sinus tenderness.  Mouth/Throat: No uvula swelling. No oropharyngeal exudate, posterior oropharyngeal edema or posterior oropharyngeal erythema.  Eyes: Conjunctivae, EOM and lids are normal. Pupils are equal, round, and reactive to light. Right eye exhibits normal extraocular motion. Left eye exhibits normal extraocular motion.  Neck: Trachea normal and full passive range of motion without pain. No edema and no erythema present.  Cardiovascular: Normal rate.   Pulmonary/Chest: Effort normal. No respiratory distress. He has no decreased breath sounds. He has no wheezes. He has no rhonchi.  Abdominal:  L flank pain  Neurological: He is alert and oriented to person, place, and time.  Skin: Skin is warm and dry. He is not diaphoretic.  Psychiatric: He has a normal mood and affect. His behavior is normal.   Results for orders placed or performed in visit on 01/31/16  POCT CBC  Result Value Ref Range   WBC 6.3 4.6 - 10.2 K/uL   Lymph, poc 1.9 0.6 - 3.4   POC LYMPH PERCENT 30.5 10 - 50 %L   MID (cbc) 0.1 0 - 0.9   POC MID % 1.9 0 - 12 %M   POC Granulocyte 4.3 2 - 6.9   Granulocyte percent 67.6 37 - 80 %G   RBC 4.66 (A) 4.69 - 6.13 M/uL   Hemoglobin 14.6 14.1 - 18.1 g/dL   HCT, POC 58.0 (A) 99.8 - 53.7 %   MCV 92.9 80 - 97 fL   MCH, POC 31.3 (A) 27 - 31.2 pg   MCHC 33.6 31.8 - 35.4 g/dL   RDW, POC 33.8 %   Platelet Count, POC 229 142 - 424 K/uL   MPV 8.4 0 - 99.8 fL  POCT Microscopic Urinalysis ([facility])  Result Value Ref Range   WBC,UR,HPF,POC None None WBC/hpf   RBC,UR,HPF,POC None None RBC/hpf   Bacteria None None, Too numerous to count   Mucus Absent Absent   Epithelial Cells, UR Per Microscopy None None, Too numerous to count cells/hpf    Assessment and Plan: Thomas Burton is a 20 y.o. male who is here today for cc of cough and regurge.  Advised h2 blocker and ppi at this time.  Will return if symptoms do not improve.   Cough - Plan: POCT CBC, DG Chest 2 View, POCT Microscopic Urinalysis ([facility]), ranitidine (ZANTAC) 150 MG tablet, omeprazole (PRILOSEC) 20 MG capsule  Regurgitation - Plan: POCT CBC, EKG 12-Lead, DG Chest 2 View, POCT Microscopic Urinalysis ([facility]), ranitidine (ZANTAC) 150 MG tablet, omeprazole (PRILOSEC) 20 MG capsule  Gastroesophageal reflux disease, esophagitis presence not specified - Plan: ranitidine (ZANTAC) 150 MG tablet, omeprazole (  PRILOSEC) 20 MG capsule  I personally performed the services described in this documentation, which was scribed in my presence. The recorded information has been reviewed and is accurate.  Trena Platt, PA-C [facility] 01/31/2016 5:24 PM

## 2016-01-31 NOTE — Patient Instructions (Addendum)
Your labs came back unremarkable.   ekg displayed no acute findings.  Chest radiograph was fine. Let's treat your reflux.  I would like you to read this GERD diet carefully.  We need to take the medicine and do these key lifestyle modifications to keep this at bay.  I would like you to return if your symptoms do not improve within 2 weeks.  You can take the medication together for up to 6 weeks, and then we can try to symptomatically treat.    Food Choices for Gastroesophageal Reflux Disease, Adult When you have gastroesophageal reflux disease (GERD), the foods you eat and your eating habits are very important. Choosing the right foods can help ease the discomfort of GERD. WHAT GENERAL GUIDELINES DO I NEED TO FOLLOW?  Choose fruits, vegetables, whole grains, low-fat dairy products, and low-fat meat, fish, and poultry.  Limit fats such as oils, salad dressings, butter, nuts, and avocado.  Keep a food diary to identify foods that cause symptoms.  Avoid foods that cause reflux. These may be different for different people.  Eat frequent small meals instead of three large meals each day.  Eat your meals slowly, in a relaxed setting.  Limit fried foods.  Cook foods using methods other than frying.  Avoid drinking alcohol.  Avoid drinking large amounts of liquids with your meals.  Avoid bending over or lying down until 2-3 hours after eating. WHAT FOODS ARE NOT RECOMMENDED? The following are some foods and drinks that may worsen your symptoms: Vegetables Tomatoes. Tomato juice. Tomato and spaghetti sauce. Chili peppers. Onion and garlic. Horseradish. Fruits Oranges, grapefruit, and lemon (fruit and juice). Meats High-fat meats, fish, and poultry. This includes hot dogs, ribs, ham, sausage, salami, and bacon. Dairy Whole milk and chocolate milk. Sour cream. Cream. Butter. Ice cream. Cream cheese.  Beverages Coffee and tea, with or without caffeine. Carbonated beverages or energy  drinks. Condiments Hot sauce. Barbecue sauce.  Sweets/Desserts Chocolate and cocoa. Donuts. Peppermint and spearmint. Fats and Oils High-fat foods, including [place] fries and potato chips. Other Vinegar. Strong spices, such as black pepper, white pepper, red pepper, cayenne, curry powder, cloves, ginger, and chili powder. The items listed above may not be a complete list of foods and beverages to avoid. Contact your dietitian for more information.   This information is not intended to replace advice given to you by your health care provider. Make sure you discuss any questions you have with your health care provider.   Document Released: 06/18/2005 Document Revised: 07/09/2014 Document Reviewed: 04/22/2013 Elsevier Interactive Patient Education 2016 [employer].     IF you received an x-ray today, you will receive an invoice from [facility]. Please contact [facility] at [phone] with questions or concerns regarding your invoice.   IF you received labwork today, you will receive an invoice from [employer]. Please contact [facility] at [phone] with questions or concerns regarding your invoice.   Our billing staff will not be able to assist you with questions regarding bills from these companies.  You will be contacted with the lab results as soon as they are available. The fastest way to get your results is to activate your My Chart account. Instructions are located on the last page of this paperwork. If you have not heard from [name] regarding the results in 2 weeks, please contact this office.

## 2016-02-22 ENCOUNTER — Telehealth: Payer: Self-pay

## 2016-02-22 NOTE — Telephone Encounter (Signed)
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   Mother left a voicemail on MR VM on Tues 8/22 at 1130am  Call back number is [phone]

## 2016-03-07 ENCOUNTER — Ambulatory Visit (INDEPENDENT_AMBULATORY_CARE_PROVIDER_SITE_OTHER): Payer: Commercial Managed Care - HMO | Admitting: Physician Assistant

## 2016-03-07 DIAGNOSIS — Z23 Encounter for immunization: Secondary | ICD-10-CM

## 2016-03-13 NOTE — Progress Notes (Signed)
Pt need a Tdap for school

## 2016-06-26 ENCOUNTER — Ambulatory Visit (INDEPENDENT_AMBULATORY_CARE_PROVIDER_SITE_OTHER): Payer: Commercial Managed Care - HMO | Admitting: Urgent Care

## 2016-06-26 VITALS — BP 120/78 | HR 54 | Temp 98.5°F | Resp 18 | Ht 70.0 in | Wt 172.0 lb

## 2016-06-26 DIAGNOSIS — Z23 Encounter for immunization: Secondary | ICD-10-CM

## 2016-06-26 DIAGNOSIS — Z0000 Encounter for general adult medical examination without abnormal findings: Secondary | ICD-10-CM

## 2016-06-26 DIAGNOSIS — Z113 Encounter for screening for infections with a predominantly sexual mode of transmission: Secondary | ICD-10-CM

## 2016-06-26 DIAGNOSIS — R21 Rash and other nonspecific skin eruption: Secondary | ICD-10-CM | POA: Diagnosis not present

## 2016-06-26 DIAGNOSIS — K120 Recurrent oral aphthae: Secondary | ICD-10-CM | POA: Diagnosis not present

## 2016-06-26 MED ORDER — KETOCONAZOLE 2 % EX CREA: 1.0000 "application " | TOPICAL_CREAM | Freq: Two times a day (BID) | CUTANEOUS | 0 refills | Status: DC

## 2016-06-26 NOTE — Progress Notes (Signed)
MRN: [id number]  Subjective:   [name] is a 20 y.o. male presenting for annual physical exam and STI screen.  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 Also plays baseball. He is sexually active with his girlfriend, does not use protection. Has good relationships at home, has a good support network. Denies smoking cigarettes. Drinks 1 drink of alcohol per year.   Medical care team includes: PCP: No PCP Per Patient Vision: Has not gotten an eye exam. Dental: No dental care. Specialists: None.   Rash - Has a 4 day history of very itchy rash over pubic area. He plays basketball, wears tight fitting spandex underwear. Denies fever, penile discharge, dysuria, hematuria, urinary frequency.   Mouth rash - Reports several week history of intermittent mouth sores. He is worried that this is herpes. He has had relationships with 2+ women. Admits that he does not brush his teeth regularly, does not floss or use mouthwash. Denies painful blisters over lips or genitals.  Thomas Burton has a current medication list which includes the following prescription(s): omeprazole and ranitidine. He is allergic to ritalin [methylphenidate].  Thomas Burton  has a past medical history of ADHD (attention deficit hyperactivity disorder); Allergy; Asthma; Bipolar 1 disorder (HCC); and Chest pain. Also  has a past surgical history that includes Knee surgery (Right, 07/02/2008) and Tympanostomy tube placement (Bilateral).  His family history includes Hypertension in his mother.  Immunizations: Flu vaccine updated today.  Review of Systems  Constitutional: Negative for chills, diaphoresis, fever, malaise/fatigue and weight loss.  HENT: Negative for congestion, ear discharge, ear pain, hearing loss, nosebleeds, sore throat and tinnitus.   Eyes: Negative for blurred vision, double vision, photophobia, pain, discharge and redness.  Respiratory: Negative for cough, shortness of breath and wheezing.     Cardiovascular: Negative for chest pain, palpitations and leg swelling.  Gastrointestinal: Negative for abdominal pain, blood in stool, constipation, diarrhea, nausea and vomiting.  Genitourinary: Negative for dysuria, flank pain, frequency, hematuria and urgency.  Musculoskeletal: Negative for back pain, joint pain and myalgias.  Skin: Positive for rash (oral, genital). Negative for itching.  Neurological: Negative for dizziness, tingling, seizures, loss of consciousness, weakness and headaches.  Endo/Heme/Allergies: Negative for polydipsia.  Psychiatric/Behavioral: Negative for depression, hallucinations, memory loss, substance abuse and suicidal ideas. The patient is not nervous/anxious and does not have insomnia.    Objective:   Vitals: BP 120/78   Pulse (!) 54   Temp 98.5 F (36.9 C) (Oral)   Resp 18   Ht 5\' 10"  (1.778 m)   Wt 172 lb (78 kg)   SpO2 98%   BMI 24.68 kg/m    Visual Acuity Screening   Right eye Left eye Both eyes  Without correction: 20/13 20/13 20/13   With correction:       Wt Readings from Last 3 Encounters:  06/26/16 172 lb (78 kg)  01/31/16 185 lb (83.9 kg)  02/15/15 184 lb (83.5 kg)    Physical Exam  Constitutional: He is oriented to person, place, and time. He appears well-developed and well-nourished.  HENT:  TM's intact bilaterally, no effusions or erythema. Nasal turbinates pink and moist, nasal passages patent. No sinus tenderness. Inner bottom lip with a 0.5cm erythematous lesion with central pus, mucous membranes moist, dentition in good repair.  Eyes: Conjunctivae and EOM are normal. Pupils are equal, round, and reactive to light. Right eye exhibits no discharge. Left eye exhibits no discharge. No scleral icterus.  Neck: Normal range of motion. Neck supple.  No thyromegaly present.  Cardiovascular: Normal rate, regular rhythm and intact distal pulses.  Exam reveals no gallop and no friction rub.   No murmur heard. Pulmonary/Chest: No stridor.  No respiratory distress. He has no wheezes. He has no rales.  Abdominal: Soft. Bowel sounds are normal. He exhibits no distension and no mass. There is no tenderness.  Musculoskeletal: Normal range of motion. He exhibits no edema or tenderness.  Lymphadenopathy:    He has no cervical adenopathy.  Neurological: He is alert and oriented to person, place, and time. He has normal reflexes.  Skin: Skin is warm and dry. Rash (erythematous area over right inguinal fold) noted. No erythema. No pallor.  Psychiatric: He has a normal mood and affect.   Assessment and Plan :   1. Annual physical exam - Discussed healthy lifestyle, diet, exercise, preventative care, vaccinations, and addressed patient's concerns.  - Medically stable, labs pending.  2. Routine screening for STI (sexually transmitted infection) - HIV antibody - RPR - GC/Chlamydia Probe Amp - Trichomonas vaginalis, RNA  3. Needs flu shot - Flu Vaccine QUAD 36+ mos IM  4. Rash of genital area - Start ketoconazole, rtc if no improvement/resolution.  5. Aphthous ulcer of mouth - Recommended oral hygiene. RTC as needed.  Wallis BambergMario Arneisha Kincannon, PA-C [facility] [phone] 06/26/2016  2:59 PM

## 2016-06-26 NOTE — Patient Instructions (Addendum)
Keeping you healthy  Get these tests  Blood pressure- Have your blood pressure checked once a year by your healthcare provider.  Normal blood pressure is 120/80.  Weight- Have your body mass index (BMI) calculated to screen for obesity.  BMI is a measure of body fat based on height and weight. You can also calculate your own BMI at https://www.west-esparza.com/www.[email or web address]/bmi/.  Cholesterol- Have your cholesterol checked regularly starting at age 22, sooner may be necessary if you have diabetes, high blood pressure, if a family member developed heart diseases at an early age or if you smoke.   Chlamydia, HIV, and other sexual transmitted disease- Get screened each year until the age of 22 then within three months of each new sexual partner.  Diabetes- Have your blood sugar checked regularly if you have high blood pressure, high cholesterol, a family history of diabetes or if you are overweight.  Get these vaccines  Flu shot- Every fall.  Tetanus shot- Every 10 years.  Menactra- Single dose; prevents meningitis.  Take these steps  Don't smoke- If you do smoke, ask your healthcare provider about quitting. For tips on how to quit, go to www.smokefree.gov or call 1-800-QUIT-NOW.  Be physically active- Exercise 5 days a week for at least 30 minutes.  If you are not already physically active start slow and gradually work up to 30 minutes of moderate physical activity.  Examples of moderate activity include walking briskly, mowing the yard, dancing, swimming bicycling, etc.  Eat a healthy diet- Eat a variety of healthy foods such as fruits, vegetables, low fat milk, low fat cheese, yogurt, lean meats, poultry, fish, beans, tofu, etc.  For more information on healthy eating, go to www.thenutritionsource.org  Drink alcohol in moderation- Limit alcohol intake two drinks or less a day.  Never drink and drive.  Dentist- Brush and floss teeth twice daily; visit your dentis twice a year.  Depression-Your emotional  health is as important as your physical health.  If you're feeling down, losing interest in things you normally enjoy please talk with your healthcare provider.  Gun Safety- If you keep a gun in your home, keep it unloaded and with the safety lock on.  Bullets should be stored separately.  Helmet use- Always wear a helmet when riding a motorcycle, bicycle, rollerblading or skateboarding.  Safe sex- If you may be exposed to a sexually transmitted infection, use a condom  Seat belts- Seat bels can save your life; always wear one.  Smoke/Carbon Monoxide detectors- These detectors need to be installed on the appropriate level of your home.  Replace batteries at least once a year.  Skin Cancer- When out in the sun, cover up and use sunscreen SPF 15 or higher.  Violence- If anyone is threatening or hurting you, please tell your healthcare provider.    Jock Itch Introduction Jock itch (tinea cruris) is a fungal infection of the skin in the groin area. It is sometimes called ringworm, even though it is not caused by worms. It is caused by a fungus, which is a type of germ that thrives in dark, damp places. Jock itch causes a rash and itching in the groin and upper thigh area. It usually goes away in 2-3 weeks with treatment. What are the causes? The fungus that causes jock itch may be spread by:  Touching a fungus infection elsewhere on your body-such as athlete's foot-and then touching your groin area.  Sharing towels or clothing with an infected person. What increases the risk? Jock  itch is most common in men and adolescent boys. This condition is more likely to develop from:  Being in hot, humid climates.  Wearing tight-fitting clothing or wet bathing suits for long periods of time.  Participating in sports.  Being overweight.  Having diabetes. What are the signs or symptoms? Symptoms of jock itch may include:  A red, pink, or brown rash in the groin area. The rash may spread to  the thighs, anus, and buttocks.  Dry and scaly skin on or around the rash.  Itchiness. How is this diagnosed? Most often, a health care provider can make the diagnosis by looking at your rash. Sometimes, a scraping of the infected skin will be taken. This sample may be tested by looking at it under a microscope or by trying to grow the fungus from the sample (culture). How is this treated? Treatment for this condition may include:  Antifungal medicine to kill the fungus. This may be in various forms:  Skin cream or ointment.  Medicine taken by mouth.  Skin cream or ointment to reduce the itching.  Compresses or medicated powders to dry the infected skin. Follow these instructions at home:  Take medicines only as directed by your health care provider. Apply skin creams or ointments exactly as directed.  Wear loose-fitting clothing.  Men should wear cotton boxer shorts.  Women should wear cotton underwear.  Change your underwear every day to keep your groin dry.  Avoid hot baths.  Dry your groin area well after bathing.  Use a separate towel to dry your groin area. This will help to prevent a spreading of the infection to other areas of your body.  Do not scratch the affected area.  Do not share towels with other people. Contact a health care provider if:  Your rash does not improve or it gets worse after 2 weeks of treatment.  Your rash is spreading.  Your rash returns after treatment is finished.  You have a fever.  You have redness, swelling, or pain in the area around your rash.  You have fluid, blood, or pus coming from your rash.  Your have your rash for more than 4 weeks. This information is not intended to replace advice given to you by your health care provider. Make sure you discuss any questions you have with your health care provider. Document Released: 06/08/2002 Document Revised: 11/24/2015 Document Reviewed: 03/30/2014  2017  Elsevier    Canker Sores Introduction Canker sores are small, painful sores that develop inside your mouth. They may also be called aphthous ulcers. You can get canker sores on the inside of your lips or cheeks, on your tongue, or anywhere inside your mouth. You can have just one canker sore or several of them. Canker sores cannot be passed from one person to another (noncontagious). These sores are different than the sores that you may get on the outside of your lips (cold sores or fever blisters). Canker sores usually start as painful red bumps. Then they turn into small white, yellow, or gray ulcers that have red borders. The ulcers may be quite painful. The pain may be worse when you eat or drink. What are the causes? The cause of this condition is not known. What increases the risk? This condition is more likely to develop in:  Women.  People in their teens or 60s.  Women who are having their menstrual period.  People who are under a lot of emotional stress.  People who do not get  enough iron or B vitamins.  People who have poor oral hygiene.  People who have an injury inside the mouth. This can happen after having dental work or from chewing something hard. What are the signs or symptoms? Along with the canker sore, symptoms may also include:  Fever.  Fatigue.  Swollen lymph nodes in your neck. How is this diagnosed? This condition can be diagnosed based on your symptoms. Your health care provider will also examine your mouth. Your health care provider may also do tests if you get canker sores often or if they are very bad. Tests may include:  Blood tests to rule out other causes of canker sores.  Taking swabs from the sore to check for infection.  Taking a small piece of skin from the sore (biopsy) to test it for cancer. How is this treated? Most canker sores clear up without treatment in about 10 days. Home care is usually the only treatment that you will need.  Over-the-counter medicines can relieve discomfort.If you have severe canker sores, your health care provider may prescribe:  Numbing ointment to relieve pain.  Vitamins.  Steroid medicines. These may be given as:  Oral pills.  Mouth rinses.  Gels.  Antibiotic mouth rinse. Follow these instructions at home:  Apply, take, or use medicines only as directed by your health care provider. These include vitamins.  If you were prescribed an antibiotic mouth rinse, finish all of it even if you start to feel better.  Until the sores are healed:  Do not drink coffee or citrus juices.  Do not eat spicy or salty foods.  Use a mild, over-the-counter mouth rinse as directed by your health care provider.  Practice good oral hygiene.  Floss your teeth every day.  Brush your teeth with a soft brush twice each day. Contact a health care provider if:  Your symptoms do not get better after two weeks.  You also have a fever or swollen glands.  You get canker sores often.  You have a canker sore that is getting larger.  You cannot eat or drink due to your canker sores. This information is not intended to replace advice given to you by your health care provider. Make sure you discuss any questions you have with your health care provider. Document Released: 10/13/2010 Document Revised: 11/24/2015 Document Reviewed: 05/19/2014  2017 Elsevier   IF you received an x-ray today, you will receive an invoice from [facility]. Please contact [facility] at [phone] with questions or concerns regarding your invoice.   IF you received labwork today, you will receive an invoice from [facility]. Please contact [facility] at [phone] with questions or concerns regarding your invoice.   Our billing staff will not be able to assist you with questions regarding bills from these companies.  You will be contacted with the lab results as soon as they are available. The fastest way  to get your results is to activate your My Chart account. Instructions are located on the last page of this paperwork. If you have not heard from us regarding the results in 2 weeks, please contact this office.

## 2016-06-27 LAB — CBC
Hematocrit: 42.8 % (ref 37.5–51.0)
Hemoglobin: 14.5 g/dL (ref 13.0–17.7)
MCH: 31.3 pg (ref 26.6–33.0)
MCHC: 33.9 g/dL (ref 31.5–35.7)
MCV: 92 fL (ref 79–97)
PLATELETS: 254 10*3/uL (ref 150–379)
RBC: 4.64 x10E6/uL (ref 4.14–5.80)
RDW: 13 % (ref 12.3–15.4)
WBC: 6.6 10*3/uL (ref 3.4–10.8)

## 2016-06-27 LAB — HIV ANTIBODY (ROUTINE TESTING W REFLEX): HIV SCREEN 4TH GENERATION: NONREACTIVE

## 2016-06-27 LAB — RPR: RPR Ser Ql: NONREACTIVE

## 2016-06-28 LAB — GC/CHLAMYDIA PROBE AMP
CHLAMYDIA, DNA PROBE: NEGATIVE
NEISSERIA GONORRHOEAE BY PCR: NEGATIVE

## 2016-06-29 LAB — TRICHOMONAS VAGINALIS, PROBE AMP: TRICH VAG BY NAA: NEGATIVE

## 2016-09-29 ENCOUNTER — Encounter (HOSPITAL_COMMUNITY): Payer: Self-pay | Admitting: *Deleted

## 2016-09-29 ENCOUNTER — Ambulatory Visit (HOSPITAL_COMMUNITY)
Admission: EM | Admit: 2016-09-29 | Discharge: 2016-09-29 | Disposition: A | Discharge status: Home / Self Care | Payer: Commercial Managed Care - HMO | Attending: Internal Medicine | Admitting: Internal Medicine

## 2016-09-29 DIAGNOSIS — R42 Dizziness and giddiness: Secondary | ICD-10-CM

## 2016-09-29 DIAGNOSIS — R51 Headache: Secondary | ICD-10-CM | POA: Diagnosis not present

## 2016-09-29 DIAGNOSIS — R110 Nausea: Secondary | ICD-10-CM

## 2016-09-29 DIAGNOSIS — R0602 Shortness of breath: Secondary | ICD-10-CM

## 2016-09-29 MED ORDER — IPRATROPIUM-ALBUTEROL 0.5-2.5 (3) MG/3ML IN SOLN
3.0000 mL | Freq: Once | RESPIRATORY_TRACT | Status: AC
  Administered 2016-09-29: 3 mL via RESPIRATORY_TRACT

## 2016-09-29 MED ORDER — IPRATROPIUM-ALBUTEROL 0.5-2.5 (3) MG/3ML IN SOLN
RESPIRATORY_TRACT | Status: AC
  Filled 2016-09-29: qty 3

## 2016-09-29 MED ORDER — ALBUTEROL SULFATE HFA 108 (90 BASE) MCG/ACT IN AERS: 2.0000 | INHALATION_SPRAY | RESPIRATORY_TRACT | 0 refills | Status: DC | PRN

## 2016-09-29 NOTE — ED Provider Notes (Signed)
[facility]    CSN: [id number] Arrival date & time: 09/29/16  7829     History   Chief Complaint Chief Complaint  Patient presents with  . Dizziness    HPI SUNG Burton is a 21 y.o. male. He feels dizzy/lightheaded and has intermittently for some time.  He plays college baseball, pitches, and is traveling with the team right now, trying to win games to get into the tournament.  Difficulty sleeping.  Lots of trouble swallowing, has sensation that food won't go down because of lump in throat.  Doesn't like to take any meds, afraid of them.  Reflux issues.  Says he does not fill prescriptions.  Not falling down, not injuring self.  Sensation that he can't get his breath, but talking a lot and fast.  HPI  Past Medical History:  Diagnosis Date  . ADHD (attention deficit hyperactivity disorder)   . Allergy   . Asthma    Exercise induced asthma  . Bipolar 1 disorder (HCC)   . Chest pain     Patient Active Problem List   Diagnosis Date Noted  . Dyspnea 11/20/2013  . Chest pain     Past Surgical History:  Procedure Laterality Date  . KNEE SURGERY Right 07/02/2008   [facility]; hospitalized for one week; Thomas Burton  . TYMPANOSTOMY TUBE PLACEMENT Bilateral        Home Medications    Prior to Admission medications   Medication Sig Start Date End Date Taking? Authorizing Provider  albuterol (PROVENTIL HFA;VENTOLIN HFA) 108 (90 Base) MCG/ACT inhaler Inhale 2 puffs into the lungs every 4 (four) hours as needed for wheezing or shortness of breath. 09/29/16   Eustace Moore, MD  omeprazole (PRILOSEC) 20 MG capsule Take 1 capsule (20 mg total) by mouth daily. 01/31/16   Collie Siad English, PA  ranitidine (ZANTAC) 150 MG tablet Take 1 tablet (150 mg total) by mouth 2 (two) times daily. 01/31/16   Garnetta Buddy, PA    Family History Family History  Problem Relation Age of Onset  . Hypertension Mother     Social History Social History  Substance Use Topics  .  Smoking status: Never Smoker  . Smokeless tobacco: Not on file  . Alcohol use No     Allergies   Ritalin [methylphenidate]   Review of Systems Review of Systems  All other systems reviewed and are negative.    Physical Exam Triage Vital Signs ED Triage Vitals [09/29/16 1942]  Enc Vitals Group     BP 129/71     Pulse Rate 60     Resp 14     Temp 98 F (36.7 C)     Temp Source Oral     SpO2 98 %     Weight      Height      Pain Score      Pain Loc    Updated Vital Signs BP 129/71 (BP Location: Left Arm)   Pulse 60   Temp 98 F (36.7 C) (Oral)   Resp 14   SpO2 98%   Physical Exam  Constitutional: He is oriented to person, place, and time. No distress.  Alert, nicely groomed Speech is somewhat pressured  HENT:  Head: Atraumatic.  Eyes:  Conjugate gaze, no eye redness/drainage  Neck: Neck supple.  Cardiovascular: Normal rate and regular rhythm.   Pulmonary/Chest: No respiratory distress. He has no wheezes. He has no rales.  Lungs clear, symmetric breath sounds  Abdominal:  Soft. He exhibits no distension.  Musculoskeletal: Normal range of motion.  Neurological: He is alert and oriented to person, place, and time.  Skin: Skin is warm and dry.  No cyanosis Lips are pink  Nursing note and vitals reviewed.    UC Treatments / Results   Procedures Procedures (including critical care time)  Medications Ordered in UC Medications  ipratropium-albuterol (DUONEB) 0.5-2.5 (3) MG/3ML nebulizer solution 3 mL (3 mLs Nebulization Given 09/29/16 2034)     Final Clinical Impressions(s) / UC Diagnoses   Final diagnoses:  Dizziness   You have a lot on your plate: high performance athlete, traveling, school, not sleeping well, difficulty with eating, tightly wound.   Might benefit from counseling to help translate symptoms.    New Prescriptions Discharge Medication List as of 09/29/2016  9:04 PM    START taking these medications   Details  albuterol (PROVENTIL  HFA;VENTOLIN HFA) 108 (90 Base) MCG/ACT inhaler Inhale 2 puffs into the lungs every 4 (four) hours as needed for wheezing or shortness of breath., Starting Sat 09/29/2016, Normal         Eustace Moore, MD 09/30/16 2224

## 2016-09-29 NOTE — ED Triage Notes (Signed)
Pt  Reports  Dizzy  Nauseated      Slight  Headache          Has  Been  Playing  Baseball  All  Week      Has  History  Of  Acid  Reflux        As   Well    Symptoms    X  5  Days      No  Injury

## 2016-09-29 NOTE — Discharge Instructions (Addendum)
You have a lot on your plate: high performance athlete, traveling, school, not sleeping well, difficulty with eating, tightly wound.   Might benefit from counseling to help translate symptoms.

## 2016-11-14 DIAGNOSIS — L2089 Other atopic dermatitis: Secondary | ICD-10-CM | POA: Diagnosis not present

## 2017-01-30 DIAGNOSIS — R05 Cough: Secondary | ICD-10-CM | POA: Diagnosis not present

## 2017-01-30 DIAGNOSIS — R0602 Shortness of breath: Secondary | ICD-10-CM | POA: Diagnosis not present

## 2017-01-30 DIAGNOSIS — T63441A Toxic effect of venom of bees, accidental (unintentional), initial encounter: Secondary | ICD-10-CM | POA: Diagnosis not present

## 2017-02-08 DIAGNOSIS — T7840XA Allergy, unspecified, initial encounter: Secondary | ICD-10-CM | POA: Diagnosis not present

## 2017-02-08 DIAGNOSIS — R1013 Epigastric pain: Secondary | ICD-10-CM | POA: Diagnosis not present

## 2017-02-08 DIAGNOSIS — R1310 Dysphagia, unspecified: Secondary | ICD-10-CM | POA: Diagnosis not present

## 2017-02-08 DIAGNOSIS — K2970 Gastritis, unspecified, without bleeding: Secondary | ICD-10-CM | POA: Diagnosis not present

## 2017-02-08 DIAGNOSIS — R069 Unspecified abnormalities of breathing: Secondary | ICD-10-CM | POA: Diagnosis not present

## 2017-02-12 ENCOUNTER — Encounter: Payer: Self-pay | Admitting: Physician Assistant

## 2017-02-27 ENCOUNTER — Ambulatory Visit (INDEPENDENT_AMBULATORY_CARE_PROVIDER_SITE_OTHER): Payer: 59 | Admitting: Physician Assistant

## 2017-02-27 ENCOUNTER — Encounter: Payer: Self-pay | Admitting: Physician Assistant

## 2017-02-27 ENCOUNTER — Encounter (INDEPENDENT_AMBULATORY_CARE_PROVIDER_SITE_OTHER): Payer: Self-pay

## 2017-02-27 VITALS — BP 110/68 | HR 60 | Ht 69.0 in | Wt 155.2 lb

## 2017-02-27 DIAGNOSIS — R059 Cough, unspecified: Secondary | ICD-10-CM

## 2017-02-27 DIAGNOSIS — R1110 Vomiting, unspecified: Secondary | ICD-10-CM

## 2017-02-27 DIAGNOSIS — R1319 Other dysphagia: Secondary | ICD-10-CM

## 2017-02-27 DIAGNOSIS — R1310 Dysphagia, unspecified: Secondary | ICD-10-CM

## 2017-02-27 DIAGNOSIS — K219 Gastro-esophageal reflux disease without esophagitis: Secondary | ICD-10-CM

## 2017-02-27 DIAGNOSIS — IMO0001 Reserved for inherently not codable concepts without codable children: Secondary | ICD-10-CM

## 2017-02-27 DIAGNOSIS — R05 Cough: Secondary | ICD-10-CM

## 2017-02-27 MED ORDER — OMEPRAZOLE 20 MG PO CPDR: 20.0000 mg | DELAYED_RELEASE_CAPSULE | Freq: Two times a day (BID) | ORAL | 11 refills | Status: DC

## 2017-02-27 NOTE — Progress Notes (Signed)
Initial assessment and plans reviewed 

## 2017-02-27 NOTE — Progress Notes (Signed)
Subjective:    Patient ID: Thomas Burton, male    DOB: 11/03/1995, 21 y.o.   MRN: [id number]  HPI Thomas Burton is a 21 year old white male, new to GI today self-referred for complaints of dysphagia, chest tightness and acid reflux. Patient has not had any prior GI evaluation. He did have an ER visit at the [facility] a couple of weeks ago with complaints of lower chest and epigastric discomfort. He was started on Carafate tablets 4 times daily and omeprazole 20 mg by mouth twice a day He relates that he has had long-term problems with acid reflux and sour brash type symptoms. Over this past year he has had increasing difficulty with acid reflux which is been particularly bothersome at nighttime. He also has developed dysphagia. He says he Thomas Burton have symptoms both with liquids solids and pills about 6 weeks ago he had an episode after eating not shows where he felt as if his esophagus was gram, he ate a piece of steak after that and feels like it "got stuck". He said he had that sensation for 2 days and stayed on liquids. Since then he has had very frequent episodes of feeling as if food is sticking or sitting in his chest with swallowing. He is also had some trouble with liquids bubbling back up. He is not having daily heartburn no abdominal pain. His appetite has been okay and he says he stays hungry but he is "afraid to eat". He does admit to having a lot of anxiety issues and is afraid that he Thomas Burton not be able to breathe if he chokes. He has lost about 30 pounds over the past year. He says initially that was intentional but over the past 3-4 months more due to his swallowing issues. Been no hematemesis, no melena or hematochezia. He is a nonsmoker. He says he has given up sodas and caffeine. He does feel that his symptoms are somewhat improved after starting on omeprazole. Patient's mother feels that he has issues with chronic anxiety and some degree of paranoia.  Review of Systems Pertinent  positive and negative review of systems were noted in the above HPI section.  All other review of systems was otherwise negative.  Outpatient Encounter Prescriptions as of 02/27/2017  Medication Sig  . omeprazole (PRILOSEC) 20 MG capsule Take 1 capsule (20 mg total) by mouth 2 (two) times daily before a meal.  . sucralfate (CARAFATE) 1 g tablet Take 1 g by mouth 4 (four) times daily -  with meals and at bedtime.  . [DISCONTINUED] omeprazole (PRILOSEC) 20 MG capsule Take 1 capsule (20 mg total) by mouth daily.  . [DISCONTINUED] albuterol (PROVENTIL HFA;VENTOLIN HFA) 108 (90 Base) MCG/ACT inhaler Inhale 2 puffs into the lungs every 4 (four) hours as needed for wheezing or shortness of breath.  . [DISCONTINUED] ranitidine (ZANTAC) 150 MG tablet Take 1 tablet (150 mg total) by mouth 2 (two) times daily.   No facility-administered encounter medications on file as of 02/27/2017.    Allergies  Allergen Reactions  . Abilify [Aripiprazole] Other (See Comments)    Choking on tongue  . Ritalin [Methylphenidate]     Anger   Patient Active Problem List   Diagnosis Date Noted  . Dyspnea 11/20/2013  . Chest pain    Social History   Social History  . Marital status: Married    Spouse name: N/A  . Number of children: 0  . Years of education: N/A   Occupational History  .  student    Social History Main Topics  . Smoking status: Never Smoker  . Smokeless tobacco: Never Used  . Alcohol use No  . Drug use: No  . Sexual activity: Not on file   Other Topics Concern  . Not on file   Social History Narrative   Marital status: single; dating a girlfriend x 3 months.      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      Education: [employer] sophomore; plays baseball.  Cs.  Career plans: baseball player; PE teacher.  Getting an associate in Arts before getting bachelors in education.      Tobacco: none      Alcohol: none       Drugs:  None      Exercise:  Starting yoga and weight lifting; plays baseball.      Sexually active:  Total partners = 3.  No history of STDs.  Condoms 85%.  Females only.      Seatbelt:  90%; driving since age 46.      Guns: secured gun in home.            [name] family history includes Heart disease in his maternal grandfather; Hypertension in his mother; Other in his paternal grandfather; Ovarian cancer in his maternal grandmother.      Objective:    Vitals:   02/27/17 0835  BP: 110/68  Pulse: 60    Physical Exam  well-developed thin young white male in no acute distress, accompanied by his mother and girlfriend. Blood pressure 110/68 pulse 60 height 5 foot 9, weight 155, BMI 22.9. 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 Rule out chronic GERD with peptic stricture Rule out possible eosinophilic esophagitis Rule out underlying motility disorder i.e. Achalasia  #2 chronic anxiety  Plan; stop Carafate tablets Continue omeprazole 20 mg by mouth twice a day.-New prescription sent Antireflux regimen Patient Thomas Burton be scheduled for EGD with possible esophageal dilation with Dr. Yancey Flemings. Procedure discussed in detail with patient including risks and benefits and he is agreeable to proceed. Further workup pending results of above.     Teran Daughenbaugh S Norwood Quezada PA-C 02/27/2017   Cc: No ref. provider  found

## 2017-02-27 NOTE — Patient Instructions (Signed)
Increase your Prilosec 20 mg to take one tablet by mouth twice daily. We sent a new prescription to the pharmacy.   Patient advised to avoid spicy, acidic, citrus, chocolate, mints, fruit and fruit juices.  Limit the intake of caffeine, alcohol and Soda.  Don't exercise too soon after eating.  Don't lie down within 3-4 hours of eating.  Elevate the head of your bed.  You have been scheduled for an endoscopy. Please follow written instructions given to you at your visit today. If you use inhalers (even only as needed), please bring them with you on the day of your procedure. Your physician has requested that you go to www.startemmi.com and enter the access code given to you at your visit today. This web site gives a general overview about your procedure. However, you should still follow specific instructions given to you by our office regarding your preparation for the procedure.

## 2017-03-27 ENCOUNTER — Encounter: Payer: Self-pay | Admitting: Internal Medicine

## 2017-04-01 ENCOUNTER — Encounter: Payer: Self-pay | Admitting: Internal Medicine

## 2017-04-01 ENCOUNTER — Ambulatory Visit (AMBULATORY_SURGERY_CENTER): Payer: 59 | Admitting: Internal Medicine

## 2017-04-01 VITALS — BP 118/62 | HR 62 | Temp 97.8°F | Resp 13 | Ht 69.0 in | Wt 155.0 lb

## 2017-04-01 DIAGNOSIS — K222 Esophageal obstruction: Secondary | ICD-10-CM | POA: Diagnosis not present

## 2017-04-01 DIAGNOSIS — R1319 Other dysphagia: Secondary | ICD-10-CM

## 2017-04-01 DIAGNOSIS — K2100 Gastro-esophageal reflux disease with esophagitis, without bleeding: Secondary | ICD-10-CM

## 2017-04-01 DIAGNOSIS — K219 Gastro-esophageal reflux disease without esophagitis: Secondary | ICD-10-CM | POA: Diagnosis not present

## 2017-04-01 DIAGNOSIS — R1310 Dysphagia, unspecified: Secondary | ICD-10-CM

## 2017-04-01 MED ORDER — SODIUM CHLORIDE 0.9 % IV SOLN: 500.0000 mL | INTRAVENOUS | Status: DC

## 2017-04-01 NOTE — Progress Notes (Signed)
To PACU, VSS. Report to [email or web address] 

## 2017-04-01 NOTE — Progress Notes (Signed)
Called to room to assist during endoscopic procedure.  Patient ID and intended procedure confirmed with present staff. Received instructions for my participation in the procedure from the performing physician.  

## 2017-04-01 NOTE — Patient Instructions (Signed)
  FOLLOW DILATATION DIET TODAY(GIVEN TO YOU AND REVIEWED WITH YOU)   INFORMATION ON ESOPHAGITIS AND ESOPHAGEAL STRICTURE GIVEN TO YOU TODAY  CONTINUE REFLUX MEDICATION (PRILOSEC)     YOU HAD AN ENDOSCOPIC PROCEDURE TODAY AT THE [facility]:   Refer to the procedure report that was given to you for any specific questions about what was found during the examination.  If the procedure report does not answer your questions, please call your gastroenterologist to clarify.  If you requested that your care partner not be given the details of your procedure findings, then the procedure report has been included in a sealed envelope for you to review at your convenience later.  YOU SHOULD EXPECT: Some feelings of bloating in the abdomen. Passage of more gas than usual.  Walking can help get rid of the air that was put into your GI tract during the procedure and reduce the bloating. If you had a lower endoscopy (such as a colonoscopy or flexible sigmoidoscopy) you may notice spotting of blood in your stool or on the toilet paper. If you underwent a bowel prep for your procedure, you may not have a normal bowel movement for a few days.  Please Note:  You might notice some irritation and congestion in your nose or some drainage.  This is from the oxygen used during your procedure.  There is no need for concern and it should clear up in a day or so.  SYMPTOMS TO REPORT IMMEDIATELY:     Following upper endoscopy (EGD)  Vomiting of blood or coffee ground material  New chest pain or pain under the shoulder blades  Painful or persistently difficult swallowing  New shortness of breath  Fever of 100F or higher  Black, tarry-looking stools  For urgent or emergent issues, a gastroenterologist can be reached at any hour by calling [phone].   DIET:  We do recommend a small meal at first, but then you may proceed to your regular diet.  Drink plenty of fluids but you should avoid alcoholic  beverages for 24 hours.  ACTIVITY:  You should plan to take it easy for the rest of today and you should NOT DRIVE or use heavy machinery until tomorrow (because of the sedation medicines used during the test).    FOLLOW UP: Our staff will call the number listed on your records the next business day following your procedure to check on you and address any questions or concerns that you may have regarding the information given to you following your procedure. If we do not reach you, we will leave a message.  However, if you are feeling well and you are not experiencing any problems, there is no need to return our call.  We will assume that you have returned to your regular daily activities without incident.  If any biopsies were taken you will be contacted by phone or by letter within the next 1-3 weeks.  Please call us at [phone] if you have not heard about the biopsies in 3 weeks.    SIGNATURES/CONFIDENTIALITY: You and/or your care partner have signed paperwork which will be entered into your electronic medical record.  These signatures attest to the fact that that the information above on your After Visit Summary has been reviewed and is understood.  Full responsibility of the confidentiality of this discharge information lies with you and/or your care-partner.

## 2017-04-01 NOTE — Op Note (Signed)
[facility] Patient Name: Thomas Burton Procedure Date: 04/01/2017 9:34 AM MRN: [id number] Endoscopist: Wilhemina Bonito. Marina Goodell , MD Age: 21 Referring MD:  Date of Birth: 04/20/1996 Gender: Male Account #: [id number] Procedure:                Upper GI endoscopy, with balloon dilation of the                            esophagus Indications:              Dysphagia, Esophageal reflux Medicines:                Monitored Anesthesia Care Procedure:                Pre-Anesthesia Assessment:                           - Prior to the procedure, a History and Physical                            was performed, and patient medications and                            allergies were reviewed. The patient's tolerance of                            previous anesthesia was also reviewed. The risks                            and benefits of the procedure and the sedation                            options and risks were discussed with the patient.                            All questions were answered, and informed consent                            was obtained. Prior Anticoagulants: The patient has                            taken no previous anticoagulant or antiplatelet                            agents. ASA Grade Assessment: I - A normal, healthy                            patient. After reviewing the risks and benefits,                            the patient was deemed in satisfactory condition to                            undergo the procedure.  After obtaining informed consent, the endoscope was                            passed under direct vision. Throughout the                            procedure, the patient's blood pressure, pulse, and                            oxygen saturations were monitored continuously. The                            Model GIF-HQ190 [id number]) scope was introduced                            through the mouth, and advanced to the second part                             of duodenum. The upper GI endoscopy was                            accomplished without difficulty. The patient                            tolerated the procedure well. Scope In: Scope Out: Findings:                 One mild benign-appearing, intrinsic stenosis was                            found 32 to 39 cm from the incisors(large caliber                            ring). This measured 1.6 cm (inner diameter). A TTS                            dilator was passed through the scope. Dilation with                            a 15-16.5-18 mm balloon dilator was performed to 18                            mm.                           There was esophagitis at the Z line as manifested                            by small erosions and edema. The exam of the                            esophagus was otherwise normal.  The stomach was normal. There was a small sliding                            hiatal hernia.                           The examined duodenum was normal.                           The cardia and gastric fundus were normal on                            retroflexion. Complications:            No immediate complications. Estimated Blood Loss:     Estimated blood loss: none. Impression:               1. GERD with esophagitis                           2. Large caliber distal esophageal ring status post                            balloon dilation. Recommendation:           1. Post-dilation diet                           2. Please resume omeprazole 20 mg daily. This can                            be obtained over-the-counter or by prescription (we                            have called [name] and previously)                           3. Chew food well                           4. Follow-up as needed. Wilhemina Bonito. Marina Goodell, MD 04/01/2017 10:00:39 AM This report has been signed electronically.

## 2017-04-02 ENCOUNTER — Telehealth: Payer: Self-pay | Admitting: *Deleted

## 2017-04-02 NOTE — Telephone Encounter (Signed)
  Follow up Call-  Call back number 04/01/2017  Post procedure Call Back phone  # [phone]  Permission to leave phone message Yes  Some recent data might be hidden   spoke with mother  Patient questions:  Do you have a fever, pain , or abdominal swelling? No. Pain Score  0 *  Have you tolerated food without any problems? Yes.    Have you been able to return to your normal activities? Yes.    Do you have any questions about your discharge instructions: Diet   No. Medications  No. Follow up visit  No.  Do you have questions or concerns about your Care? No.  Actions: * If pain score is 4 or above: No action needed, pain <4.

## 2017-04-30 DIAGNOSIS — Z888 Allergy status to other drugs, medicaments and biological substances status: Secondary | ICD-10-CM | POA: Diagnosis not present

## 2017-04-30 DIAGNOSIS — L237 Allergic contact dermatitis due to plants, except food: Secondary | ICD-10-CM | POA: Diagnosis not present

## 2018-04-02 ENCOUNTER — Encounter (INDEPENDENT_AMBULATORY_CARE_PROVIDER_SITE_OTHER): Payer: Self-pay | Admitting: Family Medicine

## 2018-04-02 ENCOUNTER — Ambulatory Visit (INDEPENDENT_AMBULATORY_CARE_PROVIDER_SITE_OTHER): Payer: 59 | Admitting: Family Medicine

## 2018-04-02 ENCOUNTER — Ambulatory Visit (INDEPENDENT_AMBULATORY_CARE_PROVIDER_SITE_OTHER): Payer: 59

## 2018-04-02 DIAGNOSIS — F419 Anxiety disorder, unspecified: Secondary | ICD-10-CM | POA: Diagnosis not present

## 2018-04-02 DIAGNOSIS — M25511 Pain in right shoulder: Secondary | ICD-10-CM | POA: Diagnosis not present

## 2018-04-02 DIAGNOSIS — M545 Low back pain, unspecified: Secondary | ICD-10-CM

## 2018-04-02 DIAGNOSIS — M542 Cervicalgia: Secondary | ICD-10-CM

## 2018-04-02 DIAGNOSIS — G8929 Other chronic pain: Secondary | ICD-10-CM

## 2018-04-02 NOTE — Progress Notes (Signed)
Office Visit Note   Patient: Thomas Burton           Date of Birth: 07-13-1995           MRN: [id number] Visit Date: 04/02/2018 Requested by: No referring provider defined for this encounter. PCP: Patient, No Pcp Per  Subjective: Chief Complaint  Patient presents with  . Neck - Pain    Neck pain radiating into upper back for about a week. Neck Pain when turning right to left and up and down     HPI: He is here with a few concerns.  He has chronic pain in his neck and right shoulder blade area.  This is been present for several years.  He has worked with physical therapist and his athletic trainer on the baseball team, but has not been able to eliminate his pain.  Pain seems to be at the C7 area, left side more so than the right and it hurts when he turns his head to the right.  The pain also is present in his shoulder blade on the right side.  He had to give up playing baseball a couple weeks ago because of his pain with throwing.  He continues to have pain on a daily basis.  In the past he had thoracic MRI scan in 2013 which was normal.  He has not had any neck or shoulder imaging.  He also struggles with chronic anxiety.  He has a history of bipolar disorder.  He is not on medication.  He eats very poorly.  He has fairly frequent panic attacks.  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                ROS: Otherwise noncontributory.  Last year he had issues with GERD and esophagitis.  He had endoscopy and eventually his symptoms subsided.  He does not take medication for that.  Objective: Vital Signs: There were no vitals taken for this visit.  Physical Exam:  Neck: Full range of motion with negative Spurling's test.  Tender to the left of C7 in the paraspinous muscles where he has a trigger point.  Also tender in the right rhomboid area.  Upper extremity strength and reflexes are normal.  He has kyphosis posture with tight pectoralis muscles.  Imaging: Cervical spine x-rays:  Alignment looks good, disc spaces are well-preserved but it seems that he has some early facet degenerative change at C6-7.  Right shoulder x-rays: I question whether he has calcification within the subacromial/subdeltoid space.    Assessment & Plan: 1.  Chronic neck and right shoulder pain, possibly myofascial.  Cannot rule out cervical disc protrusion. -We will draw some labs today and also make some dietary changes.  If symptoms persist, then MRI scan of cervical spine and possibly right shoulder. 2.  Chronic anxiety -I suspect his diet plays a large role in this so we will make some suggestions. -Also draw some labs to look for secondary causes.   Follow-Up Instructions: No follow-ups on file.       Procedures: None today   PMFS History: Patient Active Problem List   Diagnosis Date Noted  . Dyspnea 11/20/2013  . Chest pain    Past Medical History:  Diagnosis Date  . ADHD (attention deficit hyperactivity disorder)   . Allergy   . Anxiety   . Asthma    Exercise induced asthma  . Bipolar 1 disorder (HCC)   . Chest pain   . MRSA (methicillin resistant  Staphylococcus aureus)     Family History  Problem Relation Age of Onset  . Hypertension Mother   . Ovarian cancer Maternal Grandmother   . Heart disease Maternal Grandfather   . Other Paternal Grandfather        had some kind of colon surgery  . Stomach cancer Neg Hx   . Esophageal cancer Neg Hx     Past Surgical History:  Procedure Laterality Date  . ADENOIDECTOMY    . INGUINAL HERNIA REPAIR Bilateral 05/1994  . KNEE SURGERY Right 07/02/2008   MRSA; hospitalized for one week; [place]  . TYMPANOSTOMY TUBE PLACEMENT Bilateral    Social History   Occupational History  . Occupation: Consulting civil engineer  Tobacco Use  . Smoking status: Never Smoker  . Smokeless tobacco: Never Used  Substance and Sexual Activity  . Alcohol use: No  . Drug use: No  . Sexual activity: Not on file

## 2018-04-03 ENCOUNTER — Telehealth (INDEPENDENT_AMBULATORY_CARE_PROVIDER_SITE_OTHER): Payer: Self-pay | Admitting: Family Medicine

## 2018-04-03 LAB — COMPREHENSIVE METABOLIC PANEL
AG Ratio: 1.9 (calc) (ref 1.0–2.5)
ALBUMIN MSPROF: 5.1 g/dL (ref 3.6–5.1)
ALKALINE PHOSPHATASE (APISO): 89 U/L (ref 40–115)
ALT: 17 U/L (ref 9–46)
AST: 30 U/L (ref 10–40)
BUN: 18 mg/dL (ref 7–25)
CO2: 30 mmol/L (ref 20–32)
CREATININE: 1.16 mg/dL (ref 0.60–1.35)
Calcium: 10.2 mg/dL (ref 8.6–10.3)
Chloride: 102 mmol/L (ref 98–110)
Globulin: 2.7 g/dL (calc) (ref 1.9–3.7)
Glucose, Bld: 93 mg/dL (ref 65–99)
POTASSIUM: 4.3 mmol/L (ref 3.5–5.3)
Sodium: 140 mmol/L (ref 135–146)
Total Bilirubin: 0.8 mg/dL (ref 0.2–1.2)
Total Protein: 7.8 g/dL (ref 6.1–8.1)

## 2018-04-03 LAB — CBC WITH DIFFERENTIAL/PLATELET
BASOS ABS: 42 {cells}/uL (ref 0–200)
Basophils Relative: 0.8 %
EOS PCT: 1.7 %
Eosinophils Absolute: 90 cells/uL (ref 15–500)
HCT: 44.3 % (ref 38.5–50.0)
Hemoglobin: 15 g/dL (ref 13.2–17.1)
LYMPHS ABS: 1076 {cells}/uL (ref 850–3900)
MCH: 31.6 pg (ref 27.0–33.0)
MCHC: 33.9 g/dL (ref 32.0–36.0)
MCV: 93.5 fL (ref 80.0–100.0)
MPV: 11.3 fL (ref 7.5–12.5)
Monocytes Relative: 9.7 %
NEUTROS ABS: 3578 {cells}/uL (ref 1500–7800)
Neutrophils Relative %: 67.5 %
Platelets: 257 10*3/uL (ref 140–400)
RBC: 4.74 10*6/uL (ref 4.20–5.80)
RDW: 12.4 % (ref 11.0–15.0)
Total Lymphocyte: 20.3 %
WBC mixed population: 514 cells/uL (ref 200–950)
WBC: 5.3 10*3/uL (ref 3.8–10.8)

## 2018-04-03 LAB — THYROID PANEL WITH TSH
Free Thyroxine Index: 2.6 (ref 1.4–3.8)
T3 UPTAKE: 25 % (ref 22–35)
T4, Total: 10.5 ug/dL (ref 4.9–10.5)
TSH: 1.71 m[IU]/L (ref 0.40–4.50)

## 2018-04-03 LAB — COPPER, SERUM

## 2018-04-03 LAB — VITAMIN B12: VITAMIN B 12: 442 pg/mL (ref 200–1100)

## 2018-04-03 LAB — VITAMIN D 25 HYDROXY (VIT D DEFICIENCY, FRACTURES): VIT D 25 HYDROXY: 58 ng/mL (ref 30–100)

## 2018-04-03 NOTE — Telephone Encounter (Signed)
2 results are still pending and will take a few more days.  Everything else looks normal so far.

## 2018-04-04 ENCOUNTER — Telehealth (INDEPENDENT_AMBULATORY_CARE_PROVIDER_SITE_OTHER): Payer: Self-pay | Admitting: Family Medicine

## 2018-04-04 NOTE — Telephone Encounter (Signed)
Zinc and copper tests couldn't be done by lab.  I recommend taking zinc picolinate at 20-30 mg daily for the next 3-6 months to see if it improves symptoms.  We will determine whether to draw the test again at that time depending on how you're feeling.  [facility]

## 2018-04-04 NOTE — Telephone Encounter (Signed)
Pt wants to wait for now.

## 2018-04-04 NOTE — Telephone Encounter (Signed)
Talked with patient concerning message below. Received a fax from [facility] stating that the Copper and Zinc test were not performed due to no suitable specimen received.  Patient will need new orders for those 2 test and will need to have blood drawn at [facility].

## 2018-04-04 NOTE — Telephone Encounter (Signed)
Noted. Thank You.

## 2018-07-04 ENCOUNTER — Ambulatory Visit (INDEPENDENT_AMBULATORY_CARE_PROVIDER_SITE_OTHER): Payer: 59 | Admitting: Family Medicine

## 2018-07-04 ENCOUNTER — Encounter (INDEPENDENT_AMBULATORY_CARE_PROVIDER_SITE_OTHER): Payer: Self-pay | Admitting: Family Medicine

## 2018-07-04 DIAGNOSIS — M25562 Pain in left knee: Secondary | ICD-10-CM

## 2018-07-04 DIAGNOSIS — G8929 Other chronic pain: Secondary | ICD-10-CM | POA: Diagnosis not present

## 2018-07-04 DIAGNOSIS — M25511 Pain in right shoulder: Secondary | ICD-10-CM

## 2018-07-04 DIAGNOSIS — M542 Cervicalgia: Secondary | ICD-10-CM
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
## 2018-07-09 ENCOUNTER — Ambulatory Visit (INDEPENDENT_AMBULATORY_CARE_PROVIDER_SITE_OTHER): Payer: 59 | Admitting: Family Medicine

## 2018-10-22 ENCOUNTER — Ambulatory Visit (INDEPENDENT_AMBULATORY_CARE_PROVIDER_SITE_OTHER): Payer: 59 | Admitting: Family Medicine

## 2018-10-22 ENCOUNTER — Encounter (INDEPENDENT_AMBULATORY_CARE_PROVIDER_SITE_OTHER): Payer: Self-pay | Admitting: Family Medicine

## 2018-10-22 ENCOUNTER — Other Ambulatory Visit: Payer: Self-pay

## 2018-10-22 ENCOUNTER — Ambulatory Visit (INDEPENDENT_AMBULATORY_CARE_PROVIDER_SITE_OTHER): Payer: Self-pay

## 2018-10-22 VITALS — BP 135/81 | HR 57 | Temp 98.1°F | Ht 68.0 in | Wt 155.8 lb

## 2018-10-22 DIAGNOSIS — G8929 Other chronic pain: Secondary | ICD-10-CM | POA: Diagnosis not present

## 2018-10-22 DIAGNOSIS — R079 Chest pain, unspecified: Secondary | ICD-10-CM

## 2018-10-22 DIAGNOSIS — M545 Low back pain, unspecified: Secondary | ICD-10-CM

## 2018-10-22 DIAGNOSIS — F419 Anxiety disorder, unspecified: Secondary | ICD-10-CM | POA: Diagnosis not present

## 2018-10-22 MED ORDER — ESCITALOPRAM OXALATE 10 MG PO TABS: 10.0000 mg | ORAL_TABLET | Freq: Every day | ORAL | 6 refills | Status: DC
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
## 2019-02-03 ENCOUNTER — Ambulatory Visit (HOSPITAL_COMMUNITY)
Admission: EM | Admit: 2019-02-03 | Discharge: 2019-02-03 | Disposition: A | Discharge status: Home / Self Care | Payer: 59 | Attending: Emergency Medicine | Admitting: Emergency Medicine

## 2019-02-03 ENCOUNTER — Encounter (HOSPITAL_COMMUNITY): Payer: Self-pay | Admitting: Emergency Medicine

## 2019-02-03 DIAGNOSIS — T63441A Toxic effect of venom of bees, accidental (unintentional), initial encounter: Secondary | ICD-10-CM | POA: Diagnosis not present

## 2019-02-03 DIAGNOSIS — L03115 Cellulitis of right lower limb: Secondary | ICD-10-CM | POA: Diagnosis not present

## 2019-02-03 MED ORDER — CEPHALEXIN 500 MG PO CAPS: 500.0000 mg | ORAL_CAPSULE | Freq: Three times a day (TID) | ORAL | 0 refills | Status: AC

## 2019-02-03 MED ORDER — PREDNISONE 10 MG (21) PO TBPK: ORAL_TABLET | Freq: Every day | ORAL | 0 refills | Status: DC
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
## 2019-02-03 NOTE — Discharge Instructions (Signed)
Take the antibiotic Keflex and the steroid prednisone as prescribed.    Return here or follow-up with your primary care provider if the increased redness, red streaks, increased swelling or pain, or other signs of worsening.

## 2019-02-03 NOTE — ED Triage Notes (Signed)
Pt states he was stung by a hornet on Sunday, states his R ankle is red and swollen.

## 2019-06-05 ENCOUNTER — Other Ambulatory Visit: Payer: Self-pay

## 2019-06-05 ENCOUNTER — Ambulatory Visit: Payer: 59 | Admitting: Family Medicine

## 2019-06-05 ENCOUNTER — Encounter: Payer: Self-pay | Admitting: Family Medicine

## 2019-06-05 VITALS — BP 147/82 | HR 59

## 2019-06-05 DIAGNOSIS — G8929 Other chronic pain: Secondary | ICD-10-CM | POA: Diagnosis not present

## 2019-06-05 DIAGNOSIS — R221 Localized swelling, mass and lump, neck: Secondary | ICD-10-CM

## 2019-06-05 DIAGNOSIS — M25511 Pain in right shoulder: Secondary | ICD-10-CM | POA: Diagnosis not present

## 2019-06-05 DIAGNOSIS — F419 Anxiety disorder, unspecified: Secondary | ICD-10-CM | POA: Diagnosis not present

## 2019-06-05 MED ORDER — CEPHALEXIN 250 MG/5ML PO SUSR: 500.0000 mg | Freq: Three times a day (TID) | ORAL | 0 refills | Status: AC

## 2019-06-05 MED ORDER — ESCITALOPRAM OXALATE 5 MG/5ML PO SOLN: ORAL | 12 refills | Status: DC
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
## 2019-09-09 ENCOUNTER — Encounter: Payer: Self-pay | Admitting: Family Medicine

## 2019-09-09 ENCOUNTER — Ambulatory Visit (INDEPENDENT_AMBULATORY_CARE_PROVIDER_SITE_OTHER): Payer: 59 | Admitting: Family Medicine

## 2019-09-09 ENCOUNTER — Other Ambulatory Visit: Payer: Self-pay

## 2019-09-09 VITALS — BP 128/76 | HR 63

## 2019-09-09 DIAGNOSIS — R05 Cough: Secondary | ICD-10-CM

## 2019-09-09 DIAGNOSIS — M25511 Pain in right shoulder: Secondary | ICD-10-CM | POA: Diagnosis not present

## 2019-09-09 DIAGNOSIS — G8929 Other chronic pain: Secondary | ICD-10-CM

## 2019-09-09 DIAGNOSIS — R059 Cough, unspecified: Secondary | ICD-10-CM

## 2019-09-09 MED ORDER — PREDNISOLONE 15 MG/5ML PO SOLN: 30.0000 mg | Freq: Every day | ORAL | 0 refills | Status: DC
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
## 2019-09-30 ENCOUNTER — Ambulatory Visit: Payer: Self-pay

## 2019-09-30 ENCOUNTER — Ambulatory Visit (INDEPENDENT_AMBULATORY_CARE_PROVIDER_SITE_OTHER): Payer: 59 | Admitting: Family Medicine

## 2019-09-30 ENCOUNTER — Other Ambulatory Visit: Payer: Self-pay

## 2019-09-30 ENCOUNTER — Encounter: Payer: Self-pay | Admitting: Family Medicine

## 2019-09-30 DIAGNOSIS — G8929 Other chronic pain: Secondary | ICD-10-CM | POA: Diagnosis not present

## 2019-09-30 DIAGNOSIS — M25511 Pain in right shoulder: Secondary | ICD-10-CM | POA: Diagnosis not present
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
## 2020-03-04 ENCOUNTER — Other Ambulatory Visit: Payer: Self-pay

## 2020-03-04 ENCOUNTER — Encounter: Payer: Self-pay | Admitting: Family Medicine

## 2020-03-04 ENCOUNTER — Ambulatory Visit (INDEPENDENT_AMBULATORY_CARE_PROVIDER_SITE_OTHER): Payer: 59 | Admitting: Family Medicine

## 2020-03-04 DIAGNOSIS — M25511 Pain in right shoulder: Secondary | ICD-10-CM

## 2020-03-04 DIAGNOSIS — G8929 Other chronic pain: Secondary | ICD-10-CM | POA: Diagnosis not present

## 2020-03-04 DIAGNOSIS — M545 Low back pain, unspecified: Secondary | ICD-10-CM

## 2020-03-04 MED ORDER — PREDNISOLONE 15 MG/5ML PO SOLN: 30.0000 mg | Freq: Every day | ORAL | 0 refills | Status: AC
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
## 2020-09-08 ENCOUNTER — Telehealth: Payer: Self-pay | Admitting: Family Medicine

## 2020-09-08 MED ORDER — FAMOTIDINE 40 MG PO TABS: 40.0000 mg | ORAL_TABLET | Freq: Two times a day (BID) | ORAL | 0 refills | Status: DC | PRN

## 2020-09-08 MED ORDER — SUCRALFATE 1 G PO TABS: 1.0000 g | ORAL_TABLET | Freq: Four times a day (QID) | ORAL | 0 refills | Status: DC | PRN

## 2020-09-08 NOTE — Telephone Encounter (Signed)
Please advise 

## 2020-09-08 NOTE — Telephone Encounter (Signed)
I called and spoke with the patient's mom, Mordecai Maes (she is who called [name] this morning, for the patient). Advised her of the 2 medications that were sent in to the pharmacy. Note for today written - will email to the patient at [email or web address].

## 2020-09-08 NOTE — Telephone Encounter (Signed)
Pt is having bad acid reflux and he was throwing up a little blood. He couldn't go to work today due to the stomach issue. He was wondering if Dr.Hilts could squeeze him in so he can get a doctors note to go back to work tomorrow and something for the acid reflux. CB [phone]

## 2020-09-08 NOTE — Telephone Encounter (Signed)
Rx's sent.  Ok to give note for today.

## 2020-10-25 ENCOUNTER — Encounter: Payer: Self-pay | Admitting: Family Medicine

## 2020-11-09 ENCOUNTER — Ambulatory Visit (INDEPENDENT_AMBULATORY_CARE_PROVIDER_SITE_OTHER): Payer: Commercial Managed Care - PPO | Admitting: Family Medicine

## 2020-11-09 ENCOUNTER — Other Ambulatory Visit: Payer: Self-pay

## 2020-11-09 ENCOUNTER — Ambulatory Visit: Payer: Self-pay

## 2020-11-09 DIAGNOSIS — M79641 Pain in right hand: Secondary | ICD-10-CM | POA: Diagnosis not present

## 2020-11-09 DIAGNOSIS — M25511 Pain in right shoulder: Secondary | ICD-10-CM

## 2020-11-09 DIAGNOSIS — G8929 Other chronic pain: Secondary | ICD-10-CM

## 2020-11-09 NOTE — Progress Notes (Signed)
I saw and examined the patient with Dr. Marga Hoots and agree with assessment and plan as outlined.    Recurrent right shoulder pain.  Prior GH injection took edge off until a couple months ago.  Right hand pain since punching cabinet 3-4 weeks ago.  X-rays show avulsion fragment near 2nd MCP on ulnar side.  Ligaments feel stable.  Tender there, but more tender proximally where there is a small ganglion cyst (by ultrasound).  Injected right GH joint today.  If hand pain persists, could aspirate cyst.  Return as needed.
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
## 2020-11-24 ENCOUNTER — Ambulatory Visit (INDEPENDENT_AMBULATORY_CARE_PROVIDER_SITE_OTHER): Payer: Commercial Managed Care - PPO | Admitting: Family Medicine

## 2020-11-24 ENCOUNTER — Other Ambulatory Visit: Payer: Self-pay

## 2020-11-24 ENCOUNTER — Encounter: Payer: Self-pay | Admitting: Family Medicine

## 2020-11-24 VITALS — BP 120/70 | HR 58 | Ht 68.0 in | Wt 169.6 lb

## 2020-11-24 DIAGNOSIS — M40294 Other kyphosis, thoracic region: Secondary | ICD-10-CM | POA: Diagnosis not present

## 2020-11-24 DIAGNOSIS — R634 Abnormal weight loss: Secondary | ICD-10-CM | POA: Diagnosis not present

## 2020-11-24 DIAGNOSIS — K222 Esophageal obstruction: Secondary | ICD-10-CM | POA: Diagnosis not present
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
## 2020-11-25 ENCOUNTER — Ambulatory Visit: Payer: Commercial Managed Care - PPO | Admitting: Family Medicine

## 2020-11-29 NOTE — Progress Notes (Signed)
Emailed order, demographics and office note to [employer].

## 2020-12-08 ENCOUNTER — Other Ambulatory Visit: Payer: Self-pay

## 2020-12-08 ENCOUNTER — Ambulatory Visit (INDEPENDENT_AMBULATORY_CARE_PROVIDER_SITE_OTHER): Payer: Commercial Managed Care - PPO | Admitting: Internal Medicine

## 2020-12-08 ENCOUNTER — Encounter: Payer: Self-pay | Admitting: Internal Medicine

## 2020-12-08 VITALS — BP 120/66 | HR 71 | Ht 68.0 in | Wt 169.2 lb

## 2020-12-08 DIAGNOSIS — R634 Abnormal weight loss: Secondary | ICD-10-CM

## 2020-12-08 DIAGNOSIS — R1310 Dysphagia, unspecified: Secondary | ICD-10-CM

## 2020-12-08 DIAGNOSIS — K219 Gastro-esophageal reflux disease without esophagitis: Secondary | ICD-10-CM

## 2020-12-08 DIAGNOSIS — K222 Esophageal obstruction: Secondary | ICD-10-CM | POA: Diagnosis not present

## 2020-12-08 MED ORDER — PANTOPRAZOLE SODIUM 40 MG PO TBEC
40.0000 mg | DELAYED_RELEASE_TABLET | Freq: Every day | ORAL | 3 refills | Status: DC
Start: 2020-12-08 — End: 2020-12-22

## 2020-12-08 NOTE — Progress Notes (Signed)
HISTORY OF PRESENT ILLNESS:  Thomas Burton is a 25 y.o. male with a history of GERD complicated by esophagitis and esophageal stricture for which she is undergone prior esophageal dilation (October 2018).  At that time he was instructed to remain on omeprazole 20 mg daily.  At some point he came off PPI.  He is on no therapy for GERD.  Patient tells me that over the past year he has had significant problems with reflux symptoms including pyrosis.  Also worsening intermittent dysphagia.  He is concerned that if he does not chew his food well it will get stuck.  He does report weight loss in recent weeks due to fear of eating.  REVIEW OF SYSTEMS:  All non-GI ROS negative unless otherwise stated in the HPI except for anxiety  Past Medical History:  Diagnosis Date   ADHD (attention deficit hyperactivity disorder)    Allergy    Anxiety    Asthma    Exercise induced asthma   Bipolar 1 disorder (HCC)    Chest pain    MRSA (methicillin resistant Staphylococcus aureus)     Past Surgical History:  Procedure Laterality Date   ADENOIDECTOMY     INGUINAL HERNIA REPAIR Bilateral 05/1994   KNEE SURGERY Right 07/02/2008   MRSA; hospitalized for one week; [place]   TYMPANOSTOMY TUBE PLACEMENT Bilateral     Social History Thomas Burton  reports that he has never smoked. He has never been exposed to tobacco smoke. He has never used smokeless tobacco. He reports current alcohol use. He reports that he does not use drugs.  family history includes Heart disease in his maternal grandfather; Hypertension in his mother; Other in his paternal grandfather; Ovarian cancer in his maternal grandmother.  Allergies  Allergen Reactions   Methylphenidate Hcl     Other reaction(s): Unknown   Abilify [Aripiprazole] Other (See Comments)    Choking on tongue   Ritalin [Methylphenidate]     Anger       PHYSICAL EXAMINATION: Vital signs: BP 120/66   Pulse 71   Ht 5\' 8"  (1.727 m)   Wt 169 lb 3.2 oz  (76.7 kg)   SpO2 99%   BMI 25.73 kg/m   Constitutional: generally well-appearing, no acute distress Psychiatric: alert and oriented x3, cooperative Eyes: extraocular movements intact, anicteric, conjunctiva pink Mouth: oral pharynx moist, no lesions.  No thrush Neck: supple no lymphadenopathy Cardiovascular: heart regular rate and rhythm, no murmur Lungs: clear to auscultation bilaterally Abdomen: soft, nontender, nondistended, no obvious ascites, no peritoneal signs, normal bowel sounds, no organomegaly Rectal: Omitted Extremities: no clubbing, cyanosis, or lower extremity edema bilaterally Skin: no lesions on visible extremities save tattoos Neuro: No focal deficits.  Cranial nerves intact  ASSESSMENT:  1.  GERD.  Prior endoscopy with esophagitis.  Significant symptoms off medical therapy 2.  Dysphagia.  History of esophageal stricture requiring esophageal dilation (balloon to 18 mm).  Suspect recurrent stricture of the esophagus   PLAN:  1.  Reflux precautions 2.  Prescribe pantoprazole 40 mg daily.  Medication risks reviewed 3.  Schedule upper endoscopy with esophageal dilation.The nature of the procedure, as well as the risks, benefits, and alternatives were carefully and thoroughly reviewed with the patient. Ample time for discussion and questions allowed. The patient understood, was satisfied, and agreed to proceed.  4.  Chew food extremely well until endoscopy completed, to avoid the risk of food impaction.

## 2020-12-08 NOTE — Patient Instructions (Signed)
If you are age 27 or older, your body mass index should be between 23-30. Your Body mass index is 25.73 kg/m. If this is out of the aforementioned range listed, please consider follow up with your Primary Care Provider.  If you are age 23 or younger, your body mass index should be between 19-25. Your Body mass index is 25.73 kg/m. If this is out of the aformentioned range listed, please consider follow up with your Primary Care Provider.   __________________________________________________________  The [facility] providers would like to encourage you to use [facility] to communicate with providers for non-urgent requests or questions.  Due to long hold times on the telephone, sending your provider a message by [facility] may be a faster and more efficient way to get a response.  Please allow 48 business hours for a response.  Please remember that this is for non-urgent requests.   We have sent the following medications to your pharmacy for you to pick up at your convenience:  Pantoprazole  You have been scheduled for an endoscopy. Please follow written instructions given to you at your visit today. If you use inhalers (even only as needed), please bring them with you on the day of your procedure.

## 2020-12-22 ENCOUNTER — Encounter: Payer: Self-pay | Admitting: Internal Medicine

## 2020-12-22 ENCOUNTER — Ambulatory Visit (AMBULATORY_SURGERY_CENTER): Payer: Commercial Managed Care - PPO | Admitting: Internal Medicine

## 2020-12-22 ENCOUNTER — Other Ambulatory Visit: Payer: Self-pay

## 2020-12-22 VITALS — BP 102/65 | HR 60 | Temp 98.1°F | Resp 13 | Ht 68.0 in | Wt 169.0 lb

## 2020-12-22 DIAGNOSIS — R1310 Dysphagia, unspecified: Secondary | ICD-10-CM

## 2020-12-22 DIAGNOSIS — K219 Gastro-esophageal reflux disease without esophagitis: Secondary | ICD-10-CM

## 2020-12-22 DIAGNOSIS — K2100 Gastro-esophageal reflux disease with esophagitis, without bleeding: Secondary | ICD-10-CM | POA: Diagnosis present

## 2020-12-22 DIAGNOSIS — K222 Esophageal obstruction: Secondary | ICD-10-CM

## 2020-12-22 MED ORDER — PANTOPRAZOLE SODIUM 40 MG PO TBEC: 40.0000 mg | DELAYED_RELEASE_TABLET | Freq: Every day | ORAL | 3 refills | Status: DC

## 2020-12-22 MED ORDER — SODIUM CHLORIDE 0.9 % IV SOLN: 500.0000 mL | Freq: Once | INTRAVENOUS | Status: DC

## 2020-12-22 NOTE — Patient Instructions (Addendum)
Please read handouts provided. Continue present medications. Please begin pantoprazole 40 mg daily, take 1 each morning. Office follow-up with Dr. Marina Goodell in 6 weeks.    YOU HAD AN ENDOSCOPIC PROCEDURE TODAY AT THE [facility]:   Refer to the procedure report that was given to you for any specific questions about what was found during the examination.  If the procedure report does not answer your questions, please call your gastroenterologist to clarify.  If you requested that your care partner not be given the details of your procedure findings, then the procedure report has been included in a sealed envelope for you to review at your convenience later.  YOU SHOULD EXPECT: Some feelings of bloating in the abdomen. Passage of more gas than usual.  Walking can help get rid of the air that was put into your GI tract during the procedure and reduce the bloating. If you had a lower endoscopy (such as a colonoscopy or flexible sigmoidoscopy) you may notice spotting of blood in your stool or on the toilet paper. If you underwent a bowel prep for your procedure, you may not have a normal bowel movement for a few days.  Please Note:  You might notice some irritation and congestion in your nose or some drainage.  This is from the oxygen used during your procedure.  There is no need for concern and it should clear up in a day or so.  SYMPTOMS TO REPORT IMMEDIATELY:   Following upper endoscopy (EGD)  Vomiting of blood or coffee ground material  New chest pain or pain under the shoulder blades  Painful or persistently difficult swallowing  New shortness of breath  Fever of 100F or higher  Black, tarry-looking stools  For urgent or emergent issues, a gastroenterologist can be reached at any hour by calling [phone]. Do not use MyChart messaging for urgent concerns.    DIET:  Drink plenty of fluids but you should avoid alcoholic beverages for 24 hours.  ACTIVITY:  You should plan to  take it easy for the rest of today and you should NOT DRIVE or use heavy machinery until tomorrow (because of the sedation medicines used during the test).    FOLLOW UP: Our staff will call the number listed on your records 48-72 hours following your procedure to check on you and address any questions or concerns that you may have regarding the information given to you following your procedure. If we do not reach you, we will leave a message.  We will attempt to reach you two times.  During this call, we will ask if you have developed any symptoms of COVID 19. If you develop any symptoms (ie: fever, flu-like symptoms, shortness of breath, cough etc.) before then, please call [phone].  If you test positive for Covid 19 in the 2 weeks post procedure, please call and report this information to [name].    If any biopsies were taken you will be contacted by phone or by letter within the next 1-3 weeks.  Please call us at [phone] if you have not heard about the biopsies in 3 weeks.    SIGNATURES/CONFIDENTIALITY: You and/or your care partner have signed paperwork which will be entered into your electronic medical record.  These signatures attest to the fact that that the information above on your After Visit Summary has been reviewed and is understood.  Full responsibility of the confidentiality of this discharge information lies with you and/or your care-partner.

## 2020-12-22 NOTE — Op Note (Signed)
[facility] Patient Name: Thomas Burton Procedure Date: 12/22/2020 2:27 PM MRN: [id number] Endoscopist: Wilhemina Bonito. Marina Goodell , MD Age: 25 Referring MD:  Date of Birth: [birth year] Gender: Male Account #: [id number] Procedure:                Upper GI endoscopy with balloon dilation of the                            esophagus 18-20 mm Indications:              Therapeutic procedure, Dysphagia, Esophageal reflux Medicines:                Monitored Anesthesia Care Procedure:                Pre-Anesthesia Assessment:                           - Prior to the procedure, a History and Physical                            was performed, and patient medications and                            allergies were reviewed. The patient's tolerance of                            previous anesthesia was also reviewed. The risks                            and benefits of the procedure and the sedation                            options and risks were discussed with the patient.                            All questions were answered, and informed consent                            was obtained. Prior Anticoagulants: The patient has                            taken no previous anticoagulant or antiplatelet                            agents. ASA Grade Assessment: I - A normal, healthy                            patient. After reviewing the risks and benefits,                            the patient was deemed in satisfactory condition to                            undergo the procedure.  After obtaining informed consent, the endoscope was                            passed under direct vision. Throughout the                            procedure, the patient's blood pressure, pulse, and                            oxygen saturations were monitored continuously. The                            GIF [id number] #[id number] was introduced through the                            mouth, and advanced to  the second part of duodenum.                            The upper GI endoscopy was accomplished without                            difficulty. The patient tolerated the procedure                            well. Scope In: Scope Out: Findings:                 The esophagus revealed a large caliber distal                            esophageal ring and mild esophagitis. After                            completing the endoscopic survey, A TTS dilator was                            passed through the scope. Dilation with an 18-19-20                            mm balloon dilator was performed to 20 mm.                           The stomach was normal.                           The examined duodenum was normal.                           The cardia and gastric fundus were normal on                            retroflexion. Complications:            No immediate complications. Estimated Blood Loss:     Estimated blood loss: none. Impression:  1. GERD with esophagitis                           2. Large caliber esophageal ring status post                            dilation                           3. Otherwise normal EGD. Recommendation:           1. Post dilation diet                           2. Please begin pantoprazole 40 mg daily                            (previously prescribed). Take 1 each morning                           3. Office follow-up with Dr. Marina Goodell in 6 weeks Wilhemina Bonito. Marina Goodell, MD 12/22/2020 3:13:44 PM This report has been signed electronically.

## 2020-12-22 NOTE — Progress Notes (Signed)
Called to room to assist during endoscopic procedure.  Patient ID and intended procedure confirmed with present staff. Received instructions for my participation in the procedure from the performing physician.  

## 2020-12-22 NOTE — Progress Notes (Signed)
Medical history reviewed with no changes noted. VS assessed by C.W 

## 2020-12-22 NOTE — Progress Notes (Signed)
PT taken to PACU. Monitors in place. VSS. Report given to RN. 

## 2020-12-26 ENCOUNTER — Telehealth: Payer: Self-pay

## 2020-12-26 NOTE — Telephone Encounter (Signed)
  Follow up Call-  Call back number 12/22/2020  Post procedure Call Back phone  # [phone]  Permission to leave phone message Yes  Some recent data might be hidden     Left message

## 2020-12-26 NOTE — Telephone Encounter (Signed)
Attempted to reach pt. With follow-up call following endoscopic procedure 12/22/2020.  LM on pt. Voice mail to call if he has any questions or concerns. 

## 2021-02-28 ENCOUNTER — Ambulatory Visit: Payer: Self-pay

## 2021-02-28 ENCOUNTER — Ambulatory Visit (INDEPENDENT_AMBULATORY_CARE_PROVIDER_SITE_OTHER): Payer: Commercial Managed Care - PPO | Admitting: Orthopaedic Surgery

## 2021-02-28 ENCOUNTER — Encounter: Payer: Self-pay | Admitting: Orthopaedic Surgery

## 2021-02-28 ENCOUNTER — Other Ambulatory Visit: Payer: Self-pay

## 2021-02-28 DIAGNOSIS — M25521 Pain in right elbow: Secondary | ICD-10-CM

## 2021-02-28 DIAGNOSIS — M25511 Pain in right shoulder: Secondary | ICD-10-CM

## 2021-02-28 DIAGNOSIS — G8929 Other chronic pain: Secondary | ICD-10-CM | POA: Diagnosis not present
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
## 2021-05-27 ENCOUNTER — Encounter (HOSPITAL_COMMUNITY): Payer: Self-pay | Admitting: Emergency Medicine

## 2021-05-27 ENCOUNTER — Emergency Department (HOSPITAL_COMMUNITY)
Admission: EM | Admit: 2021-05-27 | Discharge: 2021-05-27 | Disposition: A | Discharge status: Home / Self Care | Payer: Self-pay | Attending: Emergency Medicine | Admitting: Emergency Medicine

## 2021-05-27 ENCOUNTER — Emergency Department (HOSPITAL_COMMUNITY): Payer: Self-pay

## 2021-05-27 DIAGNOSIS — S0083XA Contusion of other part of head, initial encounter: Secondary | ICD-10-CM | POA: Insufficient documentation

## 2021-05-27 DIAGNOSIS — H9312 Tinnitus, left ear: Secondary | ICD-10-CM | POA: Insufficient documentation

## 2021-05-27 DIAGNOSIS — F10920 Alcohol use, unspecified with intoxication, uncomplicated: Secondary | ICD-10-CM

## 2021-05-27 DIAGNOSIS — J45909 Unspecified asthma, uncomplicated: Secondary | ICD-10-CM | POA: Insufficient documentation

## 2021-05-27 DIAGNOSIS — F10129 Alcohol abuse with intoxication, unspecified: Secondary | ICD-10-CM | POA: Insufficient documentation

## 2021-05-27 LAB — ETHANOL: Alcohol, Ethyl (B): 160 mg/dL — ABNORMAL HIGH (ref <10)

## 2021-05-27 MED ORDER — ONDANSETRON 4 MG PO TBDP
4.0000 mg | ORAL_TABLET | Freq: Once | ORAL | Status: AC
  Administered 2021-05-27: 4 mg via ORAL
  Filled 2021-05-27: qty 1

## 2021-05-27 NOTE — Discharge Instructions (Addendum)
Please follow up with your PCP for further evaluation of your ear pain/hearing loss/ringing. If no improvement in symptoms in the next 1-2 weeks you may need to follow up with an ENT specialist   Place ice to your face to help with pain/inflammation. Take Ibuprofen and Tylenol as needed for pain. Drink plenty of fluids to stay hydrated.   Return to the ED for any new/worsening symptoms
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
## 2021-07-15 ENCOUNTER — Other Ambulatory Visit: Payer: Self-pay

## 2021-07-15 ENCOUNTER — Encounter (HOSPITAL_COMMUNITY): Payer: Self-pay

## 2021-07-15 ENCOUNTER — Emergency Department (HOSPITAL_COMMUNITY): Payer: Self-pay

## 2021-07-15 ENCOUNTER — Emergency Department (HOSPITAL_COMMUNITY)
Admission: EM | Admit: 2021-07-15 | Discharge: 2021-07-15 | Disposition: A | Discharge status: Home / Self Care | Payer: Self-pay | Attending: Emergency Medicine | Admitting: Emergency Medicine

## 2021-07-15 DIAGNOSIS — H66011 Acute suppurative otitis media with spontaneous rupture of ear drum, right ear: Secondary | ICD-10-CM | POA: Insufficient documentation

## 2021-07-15 DIAGNOSIS — J329 Chronic sinusitis, unspecified: Secondary | ICD-10-CM | POA: Insufficient documentation

## 2021-07-15 DIAGNOSIS — Z20822 Contact with and (suspected) exposure to covid-19: Secondary | ICD-10-CM | POA: Insufficient documentation

## 2021-07-15 DIAGNOSIS — R051 Acute cough: Secondary | ICD-10-CM | POA: Insufficient documentation

## 2021-07-15 LAB — CBC WITH DIFFERENTIAL/PLATELET
Abs Immature Granulocytes: 0.07 10*3/uL (ref 0.00–0.07)
Basophils Absolute: 0.1 10*3/uL (ref 0.0–0.1)
Basophils Relative: 0 %
Eosinophils Absolute: 0 10*3/uL (ref 0.0–0.5)
Eosinophils Relative: 0 %
HCT: 42.6 % (ref 39.0–52.0)
Hemoglobin: 13.7 g/dL (ref 13.0–17.0)
Immature Granulocytes: 0 %
Lymphocytes Relative: 5 %
Lymphs Abs: 0.8 10*3/uL (ref 0.7–4.0)
MCH: 32.1 pg (ref 26.0–34.0)
MCHC: 32.2 g/dL (ref 30.0–36.0)
MCV: 99.8 fL (ref 80.0–100.0)
Monocytes Absolute: 1.3 10*3/uL — ABNORMAL HIGH (ref 0.1–1.0)
Monocytes Relative: 9 %
Neutro Abs: 13.5 10*3/uL — ABNORMAL HIGH (ref 1.7–7.7)
Neutrophils Relative %: 86 %
Platelets: 236 10*3/uL (ref 150–400)
RBC: 4.27 MIL/uL (ref 4.22–5.81)
RDW: 13.2 % (ref 11.5–15.5)
WBC: 15.7 10*3/uL — ABNORMAL HIGH (ref 4.0–10.5)
nRBC: 0 % (ref 0.0–0.2)

## 2021-07-15 LAB — COMPREHENSIVE METABOLIC PANEL
ALT: 17 U/L (ref 0–44)
AST: 36 U/L (ref 15–41)
Albumin: 4.5 g/dL (ref 3.5–5.0)
Alkaline Phosphatase: 77 U/L (ref 38–126)
Anion gap: 9 (ref 5–15)
BUN: 12 mg/dL (ref 6–20)
CO2: 26 mmol/L (ref 22–32)
Calcium: 9.7 mg/dL (ref 8.9–10.3)
Chloride: 101 mmol/L (ref 98–111)
Creatinine, Ser: 1.17 mg/dL (ref 0.61–1.24)
GFR, Estimated: >60 mL/min (ref >60)
Glucose, Bld: 110 mg/dL — ABNORMAL HIGH (ref 70–99)
Potassium: 3.9 mmol/L (ref 3.5–5.1)
Sodium: 136 mmol/L (ref 135–145)
Total Bilirubin: 1 mg/dL (ref 0.3–1.2)
Total Protein: 8 g/dL (ref 6.5–8.1)

## 2021-07-15 LAB — RESP PANEL BY RT-PCR (FLU A&B, COVID) ARPGX2
Influenza A by PCR: NEGATIVE
Influenza B by PCR: NEGATIVE
SARS Coronavirus 2 by RT PCR: NEGATIVE

## 2021-07-15 LAB — GROUP A STREP BY PCR: Group A Strep by PCR: NOT DETECTED

## 2021-07-15 MED ORDER — AMOXICILLIN-POT CLAVULANATE 875-125 MG PO TABS
1.0000 | ORAL_TABLET | Freq: Once | ORAL | Status: AC
  Administered 2021-07-15: 1 via ORAL
  Filled 2021-07-15: qty 1

## 2021-07-15 MED ORDER — AMOXICILLIN-POT CLAVULANATE 875-125 MG PO TABS: 1.0000 | ORAL_TABLET | Freq: Two times a day (BID) | ORAL | 0 refills | Status: DC

## 2021-07-15 NOTE — ED Triage Notes (Signed)
Patient had a fever at home at 102.9F. Patient hot to touch. Ear pain began in right ear, now its in both ears. Sharp pain in his right side. Hurts in his chest when he breathes.

## 2021-07-15 NOTE — ED Provider Notes (Signed)
[facility]-EMERGENCY DEPT [facility] Emergency Department Provider Note MRN:  [id number]  Arrival date & time: 07/15/21     Chief Complaint   Fever and Otalgia   History of Present Illness   Thomas Burton is a 26 y.o. year-old male presents to the ED with chief complaint of earache, fever, sinus congestion and cough.  States that he noticed some discharge from his right ear the other day.  Reports that he began running a fever yesterday.  Denies any successful treatments prior to arrival.    Review of Systems  Pertinent review of systems noted in HPI.    Physical Exam   Vitals:   07/15/21 0223  BP: (!) 146/84  Pulse: (!) 116  Resp: 20  Temp: 98.6 F (37 C)  SpO2: 96%    CONSTITUTIONAL:  nontoxic-appearing, NAD NEURO:  Alert and oriented x 3, CN 3-12 grossly intact EYES:  eyes equal and reactive ENT/NECK:  Supple, no stridor, tympanic membranes perforation of right ear, bilateral tympanic membranes are erythematous CARDIO:  tachycardic, appears well-perfused  PULM:  No respiratory distress,  GI/GU:  non-distended,  MSK/SPINE:  No gross deformities, no edema, moves all extremities  SKIN:  no rash, atraumatic   *Additional and/or pertinent findings included in MDM below  Diagnostic and Interventional Summary    EKG Interpretation  Date/Time:    Ventricular Rate:    PR Interval:    QRS Duration:   QT Interval:    QTC Calculation:   R Axis:     Text Interpretation:         Labs Reviewed  CBC WITH DIFFERENTIAL/PLATELET - Abnormal; Notable for the following components:      Result Value   WBC 15.7 (*)    Neutro Abs 13.5 (*)    Monocytes Absolute 1.3 (*)    All other components within normal limits  COMPREHENSIVE METABOLIC PANEL - Abnormal; Notable for the following components:   Glucose, Bld 110 (*)    All other components within normal limits  GROUP A STREP BY PCR  RESP PANEL BY RT-PCR (FLU A&B, COVID) [id number]    DG Chest 2 View  Final Result       Medications - No data to display   Procedures  /  Critical Care Procedures  ED Course and Medical Decision Making  I have reviewed the triage vital signs, the nursing notes, and pertinent available records from the EMR.  Complexity of Problems Addressed Acute complicated illness or Injury  Additional Data Reviewed and Analyzed Further history obtained from: None    ED Course    I personally reviewed the labs and x-ray.  Chest x-ray shows no evidence of pneumonia.  Laboratory work-up is reassuring.  COVID and flu tests are negative.  We will treat for sinusitis and otitis media with eardrum rupture.  Recommend PCP follow-up.  Patient understands agrees with plan.  Patient prescribed Augmentin.     Final Clinical Impressions(s) / ED Diagnoses     ICD-10-CM   1. Sinusitis, unspecified chronicity, unspecified location  J32.9     2. Acute cough  R05.1     3. Acute suppurative otitis media of right ear with spontaneous rupture of tympanic membrane, recurrence not specified  H66.011       ED Discharge Orders          Ordered    amoxicillin-clavulanate (AUGMENTIN) 875-125 MG tablet  Every 12 hours       Note to Pharmacy: May change as needed  based on availability   07/15/21 0447             Discharge Instructions Discussed with and Provided to Patient:   Discharge Instructions   None      Roxy Horseman, PA-C 07/15/21 0455    Melene Plan, DO 07/15/21 [phone]

## 2021-07-15 NOTE — ED Provider Triage Note (Signed)
Emergency Medicine Provider Triage Evaluation Note  Thomas Burton , a 26 y.o. male  was evaluated in triage.  Pt complains of multiple complaints including fever, earache, cough, generalized body aches.  He states his symptoms started a day or so ago.  States that he noticed some draining from his right ear that was yellow.  He states that he has right upper abdominal/flank pain, but has been coughing persistently.  Denies successful treatments prior to arrival.  Review of Systems  Positive: Fever, otalgia, cough Negative: Rash   Physical Exam  BP (!) 146/84 (BP Location: Right Arm)    Pulse (!) 116    Temp 98.6 F (37 C) (Oral)    Resp 20    Ht 5\' 8"  (1.727 m)    Wt 70.3 kg    SpO2 96%    BMI 23.57 kg/m  Gen:   Awake, no distress   Resp:  Normal effort  MSK:   Moves extremities without difficulty  Other:  Right TM is perforated, left is intact, mildly erythematous  Medical Decision Making  Medically screening exam initiated at 2:30 AM.  Appropriate orders placed.  was informed that the remainder of the evaluation will be completed by another provider, this initial triage assessment does not replace that evaluation, and the importance of remaining in the ED until their evaluation is complete.  ? Covid, flu, or sinusitis   Rise Patience, PA-C 07/15/21 07/17/21

## 2021-10-28 ENCOUNTER — Other Ambulatory Visit: Payer: Self-pay

## 2021-10-28 ENCOUNTER — Emergency Department (HOSPITAL_BASED_OUTPATIENT_CLINIC_OR_DEPARTMENT_OTHER)
Admission: EM | Admit: 2021-10-28 | Discharge: 2021-10-28 | Disposition: A | Discharge status: Home / Self Care | Payer: 59 | Attending: Emergency Medicine | Admitting: Emergency Medicine

## 2021-10-28 ENCOUNTER — Encounter (HOSPITAL_BASED_OUTPATIENT_CLINIC_OR_DEPARTMENT_OTHER): Payer: Self-pay

## 2021-10-28 ENCOUNTER — Emergency Department (HOSPITAL_BASED_OUTPATIENT_CLINIC_OR_DEPARTMENT_OTHER): Payer: 59

## 2021-10-28 DIAGNOSIS — F10120 Alcohol abuse with intoxication, uncomplicated: Secondary | ICD-10-CM | POA: Insufficient documentation

## 2021-10-28 DIAGNOSIS — F10920 Alcohol use, unspecified with intoxication, uncomplicated: Secondary | ICD-10-CM

## 2021-10-28 DIAGNOSIS — S0990XA Unspecified injury of head, initial encounter: Secondary | ICD-10-CM
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
## 2021-10-28 NOTE — ED Notes (Signed)
ED Provider at bedside.  Pt slow to arouse for provider -- pt lethargic when aroused; pt unable to recall at this time that he was in the Emergency Dept; when re-oriented to location pt questioned why he was here.  ED provider inquired if pt would be able to locate a ride home; pt not clear with responses stating "she can walk home".  Pt allowed to continue to rest until sober per provider.  Will continue to monitor for acute changes and maintain plan of care.  ?

## 2021-10-28 NOTE — ED Provider Notes (Signed)
Patient is awake and alert, ambulatory without assistance.  States he feels much better.  Will be discharged home in stable condition. ?  ?Cheryll Cockayne, MD ?10/28/21 [phone] ? ?

## 2021-10-28 NOTE — ED Notes (Signed)
Patient reports able to take [place] home and has keys to the house. ?

## 2021-10-28 NOTE — ED Notes (Signed)
Pt now resting with eyes closed; RR even and unlabored on RA with symmetrical rise and fall of chest.  Pt has been able to stand and ambulate inside room and remain standing to void.  After pt returned to bed pt reports tripping over dogs inside residence and falling down 4 steps hitting L side of his head; unk if LOC (not reported by ems) -- provider made aware of post-arrival mobility and report of fall with head injury.  Triage with [facility] E-signature deferred due to patient falling asleep during triage with heavy ETOH use prior to arrival.   ?

## 2021-10-28 NOTE — ED Notes (Signed)
Pt to CT dept via stretcher

## 2021-10-28 NOTE — ED Notes (Addendum)
ED Provider at bedside to re-examine pt re: report of fall with head injury; pt slow to arouse for provider and unable to maintain arousal however while awake pt does continue to report a fall pta; pt did however point to R forehead region when asked where he hit his head when asked by provider (pointed to L parietal region originally for this nurse)  ?
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
## 2021-10-28 NOTE — ED Notes (Signed)
Patient is resting comfortably L lateral side with symmetrical rise and fall of chest noted; no acute distress.  Will monitor for acute changes and maintain plan of care  ?

## 2021-10-28 NOTE — ED Notes (Addendum)
Pt now returned from CT dept via stretcher - continues to rest with eyes closed; RR even and unlabored on RA; will continue to monitor for acute changes and maintain plan of care  ?

## 2021-12-16 ENCOUNTER — Other Ambulatory Visit: Payer: Self-pay

## 2021-12-16 ENCOUNTER — Encounter (HOSPITAL_COMMUNITY): Payer: Self-pay

## 2021-12-16 ENCOUNTER — Emergency Department (HOSPITAL_COMMUNITY): Payer: 59

## 2021-12-16 ENCOUNTER — Emergency Department (HOSPITAL_COMMUNITY)
Admission: EM | Admit: 2021-12-16 | Discharge: 2021-12-16 | Disposition: A | Discharge status: Home / Self Care | Payer: 59 | Attending: Emergency Medicine | Admitting: Emergency Medicine

## 2021-12-16 DIAGNOSIS — S032XXA Dislocation of tooth, initial encounter: Secondary | ICD-10-CM | POA: Diagnosis not present

## 2021-12-16 DIAGNOSIS — Z23 Encounter for immunization: Secondary | ICD-10-CM | POA: Diagnosis not present

## 2021-12-16 DIAGNOSIS — S022XXA Fracture of nasal bones, initial encounter for closed fracture: Secondary | ICD-10-CM | POA: Insufficient documentation

## 2021-12-16 DIAGNOSIS — S0993XA Unspecified injury of face, initial encounter: Secondary | ICD-10-CM | POA: Diagnosis present

## 2021-12-16 MED ORDER — AMOXICILLIN 500 MG PO CAPS: 500.0000 mg | ORAL_CAPSULE | Freq: Three times a day (TID) | ORAL | 0 refills | Status: DC

## 2021-12-16 MED ORDER — OXYCODONE-ACETAMINOPHEN 5-325 MG PO TABS
1.0000 | ORAL_TABLET | Freq: Once | ORAL | Status: AC
  Administered 2021-12-16: 1 via ORAL
  Filled 2021-12-16: qty 1

## 2021-12-16 MED ORDER — TETANUS-DIPHTH-ACELL PERTUSSIS 5-2.5-18.5 LF-MCG/0.5 IM SUSY
0.5000 mL | PREFILLED_SYRINGE | Freq: Once | INTRAMUSCULAR | Status: AC
  Administered 2021-12-16: 0.5 mL via INTRAMUSCULAR
  Filled 2021-12-16: qty 0.5
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
## 2021-12-16 NOTE — Discharge Instructions (Addendum)
You need to schedule follow-up with a dentist for your tooth injuries.  If you do not have a dentist, a follow-up number has been provided.  Additionally, you do have a nasal fracture.  Generally nothing needs to be done for this, but you can follow-up with an ENT specialist after the swelling goes down.  Call [phone] to schedule.
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
## 2022-01-03 ENCOUNTER — Encounter: Payer: Self-pay | Admitting: Gastroenterology

## 2022-01-03 ENCOUNTER — Ambulatory Visit: Payer: 59 | Admitting: Physician Assistant

## 2022-01-03 ENCOUNTER — Ambulatory Visit (INDEPENDENT_AMBULATORY_CARE_PROVIDER_SITE_OTHER): Payer: 59 | Admitting: Gastroenterology

## 2022-01-03 VITALS — BP 124/80 | HR 79 | Ht 68.0 in | Wt 154.0 lb

## 2022-01-03 DIAGNOSIS — R1310 Dysphagia, unspecified: Secondary | ICD-10-CM | POA: Diagnosis not present

## 2022-01-03 DIAGNOSIS — Z8719 Personal history of other diseases of the digestive system: Secondary | ICD-10-CM | POA: Diagnosis not present

## 2022-01-03 DIAGNOSIS — K219 Gastro-esophageal reflux disease without esophagitis: Secondary | ICD-10-CM

## 2022-01-03 MED ORDER — OMEPRAZOLE 2 MG/ML ORAL SUSPENSION: 40.0000 mg | Freq: Two times a day (BID) | ORAL | 2 refills | Status: DC

## 2022-01-03 NOTE — Patient Instructions (Signed)
If you are age 28 or older, your body mass index should be between 23-30. Your Body mass index is 23.42 kg/m. If this is out of the aforementioned range listed, please consider follow up with your Primary Care Provider.  If you are age 87 or younger, your body mass index should be between 19-25. Your Body mass index is 23.42 kg/m. If this is out of the aformentioned range listed, please consider follow up with your Primary Care Provider.   ________________________________________________________  The [facility] providers would like to encourage you to use [facility] to communicate with providers for non-urgent requests or questions.  Due to long hold times on the telephone, sending your provider a message by [facility] may be a faster and more efficient way to get a response.  Please allow 48 business hours for a response.  Please remember that this is for non-urgent requests.  _______________________________________________________  We have sent the following medications to your pharmacy for you to pick up at your convenience:  Omeprazole  You have been scheduled for an endoscopy. Please follow written instructions given to you at your visit today. If you use inhalers (even only as needed), please bring them with you on the day of your procedure.
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
## 2022-01-04 ENCOUNTER — Encounter: Payer: Self-pay | Admitting: Gastroenterology

## 2022-01-04 ENCOUNTER — Telehealth: Payer: Self-pay | Admitting: Pharmacy Technician

## 2022-01-04 ENCOUNTER — Other Ambulatory Visit (HOSPITAL_COMMUNITY): Payer: Self-pay

## 2022-01-04 DIAGNOSIS — R1310 Dysphagia, unspecified: Secondary | ICD-10-CM | POA: Insufficient documentation

## 2022-01-04 DIAGNOSIS — Z8719 Personal history of other diseases of the digestive system: Secondary | ICD-10-CM | POA: Insufficient documentation

## 2022-01-04 DIAGNOSIS — K219 Gastro-esophageal reflux disease without esophagitis: Secondary | ICD-10-CM | POA: Insufficient documentation

## 2022-01-04 NOTE — Telephone Encounter (Signed)
Patient Advocate Encounter  Received notification from [employer] that prior authorization for OMEPRAZOLE 2MG /ML is required.   PA NOT submitted on 01/04/2022 Key 03/07/2022   [id number], CPhT Patient Advocate Phone: [phone]

## 2022-01-04 NOTE — Telephone Encounter (Signed)
Please advise 

## 2022-01-08 ENCOUNTER — Encounter (HOSPITAL_BASED_OUTPATIENT_CLINIC_OR_DEPARTMENT_OTHER): Payer: Self-pay

## 2022-01-08 ENCOUNTER — Emergency Department (HOSPITAL_BASED_OUTPATIENT_CLINIC_OR_DEPARTMENT_OTHER)
Admission: EM | Admit: 2022-01-08 | Discharge: 2022-01-08 | Disposition: A | Discharge status: Home / Self Care | Payer: 59 | Attending: Emergency Medicine | Admitting: Emergency Medicine

## 2022-01-08 DIAGNOSIS — R42 Dizziness and giddiness: Secondary | ICD-10-CM | POA: Diagnosis not present

## 2022-01-08 DIAGNOSIS — J45909 Unspecified asthma, uncomplicated: Secondary | ICD-10-CM | POA: Diagnosis not present

## 2022-01-08 DIAGNOSIS — R112 Nausea with vomiting, unspecified: Secondary | ICD-10-CM | POA: Diagnosis not present

## 2022-01-08 MED ORDER — ONDANSETRON 4 MG PO TBDP
4.0000 mg | ORAL_TABLET | Freq: Once | ORAL | Status: AC
Start: 2022-01-08 — End: 2022-01-08
  Administered 2022-01-08: 4 mg via ORAL
  Filled 2022-01-08: qty 1

## 2022-01-08 NOTE — ED Notes (Signed)
D/c instructions given at length. Voiced understanding of instructions.

## 2022-01-08 NOTE — Discharge Instructions (Signed)
You were seen today for lightheadedness.  Your EKG is reassuring.  Your orthostatics are negative.  You declined lab work.  I was unable to assess you for anemia or other metabolic derangements although your vital signs are reassuring.  Make sure that you are staying hydrated.  Avoid any alcohol or illicit drug use.

## 2022-01-08 NOTE — ED Provider Notes (Signed)
[facility] EMERGENCY DEPT Provider Note   CSN: [id number] Arrival date & time: 01/08/22  0116     History  Chief Complaint  Patient presents with   Nausea   Emesis    Thomas Burton is a 26 y.o. male.  HPI     This is a 26 year old male who presents with lightheadedness and 1 episode of nausea and vomiting.  Patient states he has a significant history of reflux.  He is due for endoscopy at the end of the month.  Patient states that this evening he had sudden onset of lightheadedness.  He describes it as feeling off balance and "almost passing out."  Denies any room spinning dizziness.  He had 1 episode of nausea and vomiting.  Denies chest pain, shortness of breath, recent illnesses.  Denies any significant heat exposure or significant exertion prior to this episode.  He does have a history of anxiety but is unsure whether this is related.  He states he was not feeling particularly anxious about anything.  Denies illicit alcohol or drug use.  Home Medications Prior to Admission medications   Medication Sig Start Date End Date Taking? Authorizing Provider  omeprazole (FIRST-OMEPRAZOLE) 2 mg/mL SUSP oral suspension Take 20 mLs (40 mg total) by mouth 2 (two) times daily before a meal. 01/03/22   Zehr, Shanda Bumps D, PA-C      Allergies    Methylphenidate hcl, Abilify [aripiprazole], and Ritalin [methylphenidate]    Review of Systems   Review of Systems  Constitutional:  Negative for fever.  Gastrointestinal:  Positive for nausea and vomiting.  Neurological:  Positive for light-headedness. Negative for headaches.  All other systems reviewed and are negative.   Physical Exam Updated Vital Signs BP (!) 127/97   Pulse 67   Temp 98 F (36.7 C) (Oral)   Resp 18   Ht 1.727 m (5\' 8" )   Wt 67.1 kg   SpO2 98%   BMI 22.50 kg/m  Physical Exam Vitals and nursing note reviewed.  Constitutional:      Appearance: He is well-developed. He is not ill-appearing.  HENT:      Head: Normocephalic and atraumatic.  Eyes:     Pupils: Pupils are equal, round, and reactive to light.  Cardiovascular:     Rate and Rhythm: Normal rate and regular rhythm.     Heart sounds: Normal heart sounds. No murmur heard. Pulmonary:     Effort: Pulmonary effort is normal. No respiratory distress.     Breath sounds: Normal breath sounds. No wheezing.  Abdominal:     General: Bowel sounds are normal.     Palpations: Abdomen is soft.     Tenderness: There is no abdominal tenderness. There is no rebound.  Musculoskeletal:     Cervical back: Neck supple.  Lymphadenopathy:     Cervical: No cervical adenopathy.  Skin:    General: Skin is warm and dry.  Neurological:     Mental Status: He is alert and oriented to person, place, and time.     Comments: Cranial nerves II through XII intact, 5 out of 5 strength in all 4 extremities, no dysmetria to finger-nose-finger  Psychiatric:        Mood and Affect: Mood normal.     ED Results / Procedures / Treatments   Labs (all labs ordered are listed, but only abnormal results are displayed) Labs Reviewed - No data to display  EKG EKG Interpretation  Date/Time:  Monday January 08 2022 03:20:44 EDT  Ventricular Rate:  64 PR Interval:  168 QRS Duration: 96 QT Interval:  402 QTC Calculation: 414 R Axis:   80 Text Interpretation: Normal sinus rhythm with sinus arrhythmia Normal ECG When compared with ECG of 22-Oct-2013 17:54, No significant change was found Confirmed by Ross Marcus ([id number]) on 01/08/2022 3:38:46 AM  Radiology No results found.  Procedures Procedures    Medications Ordered in ED Medications  ondansetron (ZOFRAN-ODT) disintegrating tablet 4 mg (4 mg Oral Given 01/08/22 5053)    ED Course/ Medical Decision Making/ A&P                           Medical Decision Making Risk Prescription drug management.   This patient presents to the ED for concern of lightheadedness, nausea, vomiting, this involves an  extensive number of treatment options, and is a complaint that carries with it a high risk of complications and morbidity.  I considered the following differential and admission for this acute, potentially life threatening condition.  The differential diagnosis includes vertigo, orthostasis, dehydration, arrhythmia, metabolic derangement  MDM:    This is a 26 year old male who presents with lightheadedness.  He also had an episode of vomiting.  He is overall nontoxic and vital signs are reassuring.  Patient is declining all lab work-up.  Discussed with him that I could not fully rule out metabolic derangements or other concerns.  He is nonfocal on exam.  He was given Zofran.  Orthostatics are negative.  EKG is without acute arrhythmic change.  Given that the patient will not consent to lab work, this is the extent of my available evaluation in the emergency department.  He does have normal vital signs and is ambulatory independently.  He also has a nonfocal neuro exam.  Low suspicion for central source.  Patient was advised to stay hydrated and avoid alcohol and drug use.  (Labs, imaging, consults)  Labs: I Ordered, and personally interpreted labs.  The pertinent results include: None  Imaging Studies ordered: I ordered imaging studies including none I independently visualized and interpreted imaging. I agree with the radiologist interpretation  Additional history obtained from chart review.  External records from outside source obtained and reviewed including prior evaluations  Cardiac Monitoring: The patient was maintained on a cardiac monitor.  I personally viewed and interpreted the cardiac monitored which showed an underlying rhythm of: Normal sinus rhythm  Reevaluation: After the interventions noted above, I reevaluated the patient and found that they have :stayed the same  Social Determinants of Health: Lives independently  Disposition: Discharge  Co morbidities that complicate  the patient evaluation  Past Medical History:  Diagnosis Date   ADHD (attention deficit hyperactivity disorder)    Allergy    Anxiety    Asthma    Exercise induced asthma   Bipolar 1 disorder (HCC)    Chest pain    MRSA (methicillin resistant Staphylococcus aureus)      Medicines Meds ordered this encounter  Medications   ondansetron (ZOFRAN-ODT) disintegrating tablet 4 mg    I have reviewed the patients home medicines and have made adjustments as needed  Problem List / ED Course: Problem List Items Addressed This Visit   None Visit Diagnoses     Lightheadedness    -  Primary                   Final Clinical Impression(s) / ED Diagnoses Final diagnoses:  Lightheadedness    Rx /  DC Orders ED Discharge Orders     None         Chrishawn Boley, Mayer Masker, MD 01/08/22 [phone]

## 2022-01-08 NOTE — ED Triage Notes (Signed)
Pt presents to the ED with nausea and vomiting that started today. States that he has had two episodes of vomiting today and it was just stomach acid. States that he does have an hx of anxiety and reports feeling anxious today. Reports dizziness that comes and goes.

## 2022-01-08 NOTE — ED Notes (Signed)
Pt refused blood work at this time.

## 2022-01-09 MED ORDER — PANTOPRAZOLE SODIUM 40 MG PO PACK: 40.0000 mg | PACK | Freq: Every day | ORAL | 5 refills | Status: DC

## 2022-01-09 NOTE — Telephone Encounter (Signed)
Protonix granules sent to pharmacy.

## 2022-01-22 ENCOUNTER — Encounter: Payer: Self-pay | Admitting: Internal Medicine

## 2022-01-25 ENCOUNTER — Encounter: Payer: Self-pay | Admitting: Internal Medicine

## 2022-01-25 ENCOUNTER — Ambulatory Visit (AMBULATORY_SURGERY_CENTER): Payer: 59 | Admitting: Internal Medicine

## 2022-01-25 VITALS — BP 121/60 | HR 62 | Temp 99.3°F | Resp 12 | Ht 68.0 in | Wt 154.0 lb

## 2022-01-25 DIAGNOSIS — R1310 Dysphagia, unspecified: Secondary | ICD-10-CM

## 2022-01-25 DIAGNOSIS — K222 Esophageal obstruction: Secondary | ICD-10-CM

## 2022-01-25 DIAGNOSIS — K219 Gastro-esophageal reflux disease without esophagitis: Secondary | ICD-10-CM | POA: Diagnosis present

## 2022-01-25 MED ORDER — SODIUM CHLORIDE 0.9 % IV SOLN: 500.0000 mL | INTRAVENOUS | Status: DC

## 2022-01-25 NOTE — Op Note (Signed)
[facility] Patient Name: Thomas Burton Procedure Date: 01/25/2022 2:09 PM MRN: [id number] Endoscopist: Wilhemina Bonito. Marina Goodell , MD Age: 26 Referring MD:  Date of Birth: 1996/06/10 Gender: Male Account #: [id number] Procedure:                Upper GI endoscopy with balloon dilation of the                            esophagus. 20 mm Max Indications:              Therapeutic procedure, Dysphagia, Esophageal reflux Medicines:                Monitored Anesthesia Care Procedure:                Pre-Anesthesia Assessment:                           - Prior to the procedure, a History and Physical                            was performed, and patient medications and                            allergies were reviewed. The patient's tolerance of                            previous anesthesia was also reviewed. The risks                            and benefits of the procedure and the sedation                            options and risks were discussed with the patient.                            All questions were answered, and informed consent                            was obtained. Prior Anticoagulants: The patient has                            taken no previous anticoagulant or antiplatelet                            agents. ASA Grade Assessment: II - A patient with                            mild systemic disease. After reviewing the risks                            and benefits, the patient was deemed in                            satisfactory condition to undergo the procedure.  After obtaining informed consent, the endoscope was                            passed under direct vision. Throughout the                            procedure, the patient's blood pressure, pulse, and                            oxygen saturations were monitored continuously. The                            Endoscope was introduced through the mouth, and                            advanced  to the second part of duodenum. The upper                            GI endoscopy was accomplished without difficulty.                            The patient tolerated the procedure well. Scope In: Scope Out: Findings:                 One benign-appearing, intrinsic moderate stenosis                            was found 41 cm from the incisors. This stenosis                            measured 1.5 cm (inner diameter). The stenosis was                            traversed. A TTS dilator was passed through the                            scope. Dilation with an 18-19-20 mm balloon dilator                            was performed to 20 mm.                           The esophagus revealed mild inflammation as                            manifested by slight friability and edema. No                            Barrett's.                           The stomach was normal.                           The examined duodenum was normal.  The cardia and gastric fundus were normal on                            retroflexion. Complications:            No immediate complications. Estimated Blood Loss:     Estimated blood loss: none. Impression:               1. GERD with mild esophagitis large caliber                            stricture status post dilation                           2. Otherwise normal EGD. Recommendation:           1. Patient has a contact number available for                            emergencies. The signs and symptoms of potential                            delayed complications were discussed with the                            patient. Return to normal activities tomorrow.                            Written discharge instructions were provided to the                            patient.                           2. Post dilation diet.                           3. Continue present medications.                           4. We prescribed liquid omeprazole for  the patient                            because he cannot tolerate pill form of PPI. He                            tells me that there was some pharmacy related                            issue. I have asked him to contact my office nurse                            to describe what might be the issue to see if there                            is anything we can do to  help. This patient needs                            PPI for his GERD. Wilhemina Bonito. Marina Goodell, MD 01/25/2022 2:44:43 PM This report has been signed electronically.

## 2022-01-25 NOTE — Patient Instructions (Addendum)
YOU HAD AN ENDOSCOPIC PROCEDURE TODAY AT THE [facility]:   Refer to the procedure report that was given to you for any specific questions about what was found during the examination.  If the procedure report does not answer your questions, please call your gastroenterologist to clarify.  If you requested that your care partner not be given the details of your procedure findings, then the procedure report has been included in a sealed envelope for you to review at your convenience later.  YOU SHOULD EXPECT: Some feelings of bloating in the abdomen. Passage of more gas than usual.  Walking can help get rid of the air that was put into your GI tract during the procedure and reduce the bloating. If you had a lower endoscopy (such as a colonoscopy or flexible sigmoidoscopy) you may notice spotting of blood in your stool or on the toilet paper. If you underwent a bowel prep for your procedure, you may not have a normal bowel movement for a few days.  Please Note:  You might notice some irritation and congestion in your nose or some drainage.  This is from the oxygen used during your procedure.  There is no need for concern and it should clear up in a day or so.  SYMPTOMS TO REPORT IMMEDIATELY:   Following upper endoscopy (EGD)  Vomiting of blood or coffee ground material  New chest pain or pain under the shoulder blades  Painful or persistently difficult swallowing  New shortness of breath  Fever of 100F or higher  Black, tarry-looking stools  For urgent or emergent issues, a gastroenterologist can be reached at any hour by calling [phone]. Do not use MyChart messaging for urgent concerns.    DIET:  Follow a post dilation diet (see handout given to you by your recovery nurse): Nothing to eat or drink until 4:00pm, then you may start Clear Liquids only from 4:00pm to 5:00pm, then at 5:00pm you may proceed with a soft diet. You may then proceed to your regular diet as tolerated  tomorrow morning. Drink plenty of fluids but you should avoid alcoholic beverages for 24 hours.  MEDICATIONS: Continue present medications. Call Dr. Lamar Sprinkles office nurse so she can help you with the pharmacy issue you're having and we can get your PPI ordered to treat your GERD.  Please see handouts given to you by your recovery nurse.  Thank you for allowing [name] to provide for your healthcare needs today.  ACTIVITY:  You should plan to take it easy for the rest of today and you should NOT DRIVE or use heavy machinery until tomorrow (because of the sedation medicines used during the test).    FOLLOW UP: Our staff will call the number listed on your records the next business day following your procedure.  We will call around 7:15- 8:00 am to check on you and address any questions or concerns that you may have regarding the information given to you following your procedure. If we do not reach you, we will leave a message.  If you develop any symptoms (ie: fever, flu-like symptoms, shortness of breath, cough etc.) before then, please call [phone].  If you test positive for Covid 19 in the 2 weeks post procedure, please call and report this information to [name].    If any biopsies were taken you will be contacted by phone or by letter within the next 1-3 weeks.  Please call us at [phone] if you have not heard about the  biopsies in 3 weeks.    SIGNATURES/CONFIDENTIALITY: You and/or your care partner have signed paperwork which will be entered into your electronic medical record.  These signatures attest to the fact that that the information above on your After Visit Summary has been reviewed and is understood.  Full responsibility of the confidentiality of this discharge information lies with you and/or your care-partner.

## 2022-01-25 NOTE — Progress Notes (Signed)
Pt's states no medical or surgical changes since previsit or office visit. 

## 2022-01-26 ENCOUNTER — Telehealth: Payer: Self-pay | Admitting: *Deleted

## 2022-01-26 NOTE — Telephone Encounter (Signed)
  Follow up Call-     01/25/2022    1:17 PM 12/22/2020    2:29 PM  Call back number  Post procedure Call Back phone  # [phone]  Permission to leave phone message Yes Yes     Patient questions: VM box is not set up yet.

## 2022-10-15 ENCOUNTER — Other Ambulatory Visit: Payer: Self-pay

## 2022-10-15 ENCOUNTER — Emergency Department (HOSPITAL_COMMUNITY)
Admission: EM | Admit: 2022-10-15 | Discharge: 2022-10-15 | Disposition: A | Discharge status: Home / Self Care | Payer: 59 | Attending: Emergency Medicine | Admitting: Emergency Medicine

## 2022-10-15 DIAGNOSIS — R109 Unspecified abdominal pain: Secondary | ICD-10-CM | POA: Insufficient documentation

## 2022-10-15 DIAGNOSIS — R112 Nausea with vomiting, unspecified: Secondary | ICD-10-CM | POA: Insufficient documentation

## 2022-10-15 DIAGNOSIS — R197 Diarrhea, unspecified: Secondary | ICD-10-CM | POA: Insufficient documentation

## 2022-10-15 DIAGNOSIS — E876 Hypokalemia: Secondary | ICD-10-CM | POA: Insufficient documentation

## 2022-10-15 LAB — CBC
HCT: 44.8 % (ref 39.0–52.0)
Hemoglobin: 14.7 g/dL (ref 13.0–17.0)
MCH: 32.1 pg (ref 26.0–34.0)
MCHC: 32.8 g/dL (ref 30.0–36.0)
MCV: 97.8 fL (ref 80.0–100.0)
Platelets: 207 10*3/uL (ref 150–400)
RBC: 4.58 MIL/uL (ref 4.22–5.81)
RDW: 13 % (ref 11.5–15.5)
WBC: 10.7 10*3/uL — ABNORMAL HIGH (ref 4.0–10.5)
nRBC: 0 % (ref 0.0–0.2)

## 2022-10-15 LAB — COMPREHENSIVE METABOLIC PANEL
ALT: 17 U/L (ref 0–44)
AST: 45 U/L — ABNORMAL HIGH (ref 15–41)
Albumin: 4.9 g/dL (ref 3.5–5.0)
Alkaline Phosphatase: 74 U/L (ref 38–126)
Anion gap: 10 (ref 5–15)
BUN: 17 mg/dL (ref 6–20)
CO2: 26 mmol/L (ref 22–32)
Calcium: 9.2 mg/dL (ref 8.9–10.3)
Chloride: 101 mmol/L (ref 98–111)
Creatinine, Ser: 1.1 mg/dL (ref 0.61–1.24)
GFR, Estimated: >60 mL/min (ref >60)
Glucose, Bld: 117 mg/dL — ABNORMAL HIGH (ref 70–99)
Potassium: 3.4 mmol/L — ABNORMAL LOW (ref 3.5–5.1)
Sodium: 137 mmol/L (ref 135–145)
Total Bilirubin: 1.2 mg/dL (ref 0.3–1.2)
Total Protein: 8.3 g/dL — ABNORMAL HIGH (ref 6.5–8.1)

## 2022-10-15 LAB — URINALYSIS, ROUTINE W REFLEX MICROSCOPIC
Bilirubin Urine: NEGATIVE
Glucose, UA: NEGATIVE mg/dL
Hgb urine dipstick: NEGATIVE
Ketones, ur: NEGATIVE mg/dL
Leukocytes,Ua: NEGATIVE
Nitrite: NEGATIVE
Protein, ur: NEGATIVE mg/dL
Specific Gravity, Urine: 1.023 (ref 1.005–1.030)
pH: 5 (ref 5.0–8.0)

## 2022-10-15 LAB — LIPASE, BLOOD: Lipase: 35 U/L (ref 11–51)

## 2022-10-15 MED ORDER — FENTANYL CITRATE PF 50 MCG/ML IJ SOSY
50.0000 ug | PREFILLED_SYRINGE | Freq: Once | INTRAMUSCULAR | Status: DC
  Filled 2022-10-15: qty 1

## 2022-10-15 MED ORDER — PANTOPRAZOLE SODIUM 40 MG IV SOLR
40.0000 mg | Freq: Once | INTRAVENOUS | Status: AC
  Administered 2022-10-15: 40 mg via INTRAVENOUS
  Filled 2022-10-15: qty 10

## 2022-10-15 MED ORDER — LACTATED RINGERS IV BOLUS
1000.0000 mL | Freq: Once | INTRAVENOUS | Status: AC
  Administered 2022-10-15: 1000 mL via INTRAVENOUS

## 2022-10-15 MED ORDER — ONDANSETRON 4 MG PO TBDP: 4.0000 mg | ORAL_TABLET | Freq: Three times a day (TID) | ORAL | 0 refills | Status: DC | PRN

## 2022-10-15 MED ORDER — ONDANSETRON HCL 4 MG/2ML IJ SOLN
4.0000 mg | Freq: Once | INTRAMUSCULAR | Status: AC
  Administered 2022-10-15: 4 mg via INTRAVENOUS
  Filled 2022-10-15: qty 2

## 2022-10-15 NOTE — ED Triage Notes (Signed)
Pt reporting that he has had diarrhea for several days. Onset of vomiting tonight. Says he was in the bathroom, was vomiting, passed out and hit his head on the wall. Epigastric and left sided abdominal pain.

## 2022-10-15 NOTE — ED Provider Notes (Signed)
[place] EMERGENCY DEPARTMENT AT [facility] Provider Note   CSN: [id number] Arrival date & time: 10/15/22  2831     History  Chief Complaint  Patient presents with   Diarrhea    Thomas Burton is a 27 y.o. male.  The history is provided by the patient.  Diarrhea Thomas Burton is a 27 y.o. male who presents to the Emergency Department complaining of vomiting and diarrhea.  He presents to the emergency department for evaluation of vomiting and diarrhea that started several hours ago.  He reports greater than 15 episodes of emesis this started this evening with burning epigastric pain, lower abdominal pain and bilateral low back pain.  He has had diarrhea off and on for a few days but today developed 10-15 episodes of watery diarrhea.  He has a history of GERD and esophageal stricture and follows with [facility].  He is supposed to take omeprazole but does not take it because he cannot swallow pills.  Vapes occasionally.  Drinks a large amount of alcohol every Friday.  No street drugs.      Home Medications Prior to Admission medications   Medication Sig Start Date End Date Taking? Authorizing Provider  acetaminophen (TYLENOL) 500 MG tablet Take 500 mg by mouth every 6 (six) hours as needed for moderate pain.   Yes [provider]  ibuprofen (ADVIL) 200 MG tablet Take 200 mg by mouth every 6 (six) hours as needed for moderate pain.   Yes [provider]  ondansetron (ZOFRAN-ODT) 4 MG disintegrating tablet Take 1 tablet (4 mg total) by mouth every 8 (eight) hours as needed for nausea or vomiting. 10/15/22  Yes Tilden Fossa, MD  pantoprazole sodium (PROTONIX) 40 mg Place 40 mg into feeding tube daily. Patient not taking: Reported on 01/25/2022 01/09/22   Zehr, Princella Pellegrini, PA-C      Allergies    Methylphenidate hcl, Abilify [aripiprazole], and Ritalin [methylphenidate]    Review of Systems   Review of Systems  Gastrointestinal:  Positive for  diarrhea.  All other systems reviewed and are negative.   Physical Exam Updated Vital Signs BP 123/69   Pulse 91   Temp (!) 100.5 F (38.1 C) (Oral) Comment: pt educated on fever management. verbalized understanding  Resp 18   SpO2 99%  Physical Exam Vitals and nursing note reviewed.  Constitutional:      Appearance: He is well-developed.  HENT:     Head: Normocephalic and atraumatic.  Cardiovascular:     Rate and Rhythm: Normal rate and regular rhythm.  Pulmonary:     Effort: Pulmonary effort is normal. No respiratory distress.  Abdominal:     Palpations: Abdomen is soft.     Tenderness: There is no abdominal tenderness. There is no guarding or rebound.     Comments: Abdominal pain improved on palpation  Musculoskeletal:        General: No tenderness.  Skin:    General: Skin is warm and dry.  Neurological:     Mental Status: He is alert and oriented to person, place, and time.  Psychiatric:        Behavior: Behavior normal.     ED Results / Procedures / Treatments   Labs (all labs ordered are listed, but only abnormal results are displayed) Labs Reviewed  COMPREHENSIVE METABOLIC PANEL - Abnormal; Notable for the following components:      Result Value   Potassium 3.4 (*)    Glucose, Bld 117 (*)  Total Protein 8.3 (*)    AST 45 (*)    All other components within normal limits  CBC - Abnormal; Notable for the following components:   WBC 10.7 (*)    All other components within normal limits  LIPASE, BLOOD  URINALYSIS, ROUTINE W REFLEX MICROSCOPIC    EKG None  Radiology No results found.  Procedures Procedures    Medications Ordered in ED Medications  fentaNYL (SUBLIMAZE) injection 50 mcg (50 mcg Intravenous Not Given 10/15/22 0528)  ondansetron (ZOFRAN) injection 4 mg (4 mg Intravenous Given 10/15/22 0528)  lactated ringers bolus 1,000 mL (0 mLs Intravenous Stopped 10/15/22 0630)  pantoprazole (PROTONIX) injection 40 mg (40 mg Intravenous Given  10/15/22 0528)    ED Course/ Medical Decision Making/ A&P                             Medical Decision Making Amount and/or Complexity of Data Reviewed Labs: ordered.  Risk Prescription drug management.   Patient with history of GERD, esophageal stricture here for evaluation of vomiting and diarrhea.  His abdominal pain actually improves on examination and palpation.  Labs with mild hypokalemia otherwise no significant abnormality.  Current clinical picture not consistent with perforated viscus, pancreatitis, cholecystitis, appendicitis.  Of note this provider was not aware of discharge vital signs of temperature of 100.5.  Discussed with patient home care for vomiting and diarrhea.  Discussed oral fluid hydration with close return precautions for progressive or new concerning symptoms.        Final Clinical Impression(s) / ED Diagnoses Final diagnoses:  Nausea vomiting and diarrhea    Rx / DC Orders ED Discharge Orders          Ordered    ondansetron (ZOFRAN-ODT) 4 MG disintegrating tablet  Every 8 hours PRN        10/15/22 0600              Tilden Fossa, MD 10/15/22 0710

## 2023-05-14 ENCOUNTER — Other Ambulatory Visit: Payer: Self-pay

## 2023-05-14 ENCOUNTER — Emergency Department (HOSPITAL_COMMUNITY)
Admission: EM | Admit: 2023-05-14 | Discharge: 2023-05-14 | Disposition: A | Discharge status: Home / Self Care | Payer: Self-pay | Attending: Emergency Medicine | Admitting: Emergency Medicine

## 2023-05-14 ENCOUNTER — Encounter (HOSPITAL_COMMUNITY): Payer: Self-pay

## 2023-05-14 DIAGNOSIS — R0602 Shortness of breath: Secondary | ICD-10-CM | POA: Insufficient documentation

## 2023-05-14 DIAGNOSIS — K219 Gastro-esophageal reflux disease without esophagitis: Secondary | ICD-10-CM | POA: Insufficient documentation

## 2023-05-14 MED ORDER — MAALOX MAX 400-400-40 MG/5ML PO SUSP: 15.0000 mL | Freq: Four times a day (QID) | ORAL | 0 refills | Status: AC | PRN

## 2023-05-14 MED ORDER — FAMOTIDINE 20 MG PO TABS: 20.0000 mg | ORAL_TABLET | Freq: Two times a day (BID) | ORAL | 0 refills | Status: AC

## 2023-05-14 MED ORDER — ALUM & MAG HYDROXIDE-SIMETH 200-200-20 MG/5ML PO SUSP
30.0000 mL | Freq: Once | ORAL | Status: AC
  Administered 2023-05-14: 30 mL via ORAL
  Filled 2023-05-14: qty 30

## 2023-05-14 MED ORDER — LIDOCAINE VISCOUS HCL 2 % MT SOLN
15.0000 mL | Freq: Once | OROMUCOSAL | Status: AC
  Administered 2023-05-14: 15 mL via ORAL
  Filled 2023-05-14: qty 15

## 2023-05-14 NOTE — ED Triage Notes (Signed)
Per EMS  Burning in chest Hx of GERD Unable to swallow "Feels like something is stuck" Normally vomits up food when this happens Pt reports not vomiting this time Unable to catch his breath

## 2023-05-14 NOTE — Discharge Instructions (Signed)
Return for any problem.  ?

## 2023-05-14 NOTE — ED Provider Notes (Signed)
[place] EMERGENCY DEPARTMENT AT [facility] Provider Note   CSN: [id number] Arrival date & time: 05/14/23  1819     History  Chief Complaint  Patient presents with   burning in chest   unable to swallow   Shortness of Breath    Thomas Burton is a 27 y.o. male.  27 year old male with prior medical history as detailed below presents for evaluation.  Patient reports longstanding issues with GERD.  He reports that his GERD symptoms have worsened over the course of the afternoon.  He complains of epigastric and substernal discomfort.  He feels like something is stuck when he tries to swallow.  He is able to swallow his own secretions.  He is speech is normal.  He is denying any difficulty breathing.  He denies chest pain.  The history is provided by the patient and medical records.       Home Medications Prior to Admission medications   Medication Sig Start Date End Date Taking? Authorizing Provider  acetaminophen (TYLENOL) 500 MG tablet Take 500 mg by mouth every 6 (six) hours as needed for moderate pain.    [provider]  ibuprofen (ADVIL) 200 MG tablet Take 200 mg by mouth every 6 (six) hours as needed for moderate pain.    [provider]  ondansetron (ZOFRAN-ODT) 4 MG disintegrating tablet Take 1 tablet (4 mg total) by mouth every 8 (eight) hours as needed for nausea or vomiting. 10/15/22   Tilden Fossa, MD  pantoprazole sodium (PROTONIX) 40 mg Place 40 mg into feeding tube daily. Patient not taking: Reported on 01/25/2022 01/09/22   Zehr, Princella Pellegrini, PA-C      Allergies    Methylphenidate hcl, Abilify [aripiprazole], and Ritalin [methylphenidate]    Review of Systems   Review of Systems  All other systems reviewed and are negative.   Physical Exam Updated Vital Signs BP (!) 150/79 (BP Location: Right Arm)   Pulse 69   Temp 97.9 F (36.6 C) (Oral)   Resp 18   Ht 5\' 8"  (1.727 m)   Wt 69.9 kg   SpO2 98%   BMI 23.42 kg/m   Physical Exam Vitals and nursing note reviewed.  Constitutional:      General: He is not in acute distress.    Appearance: Normal appearance. He is well-developed.  HENT:     Head: Normocephalic and atraumatic.  Eyes:     Conjunctiva/sclera: Conjunctivae normal.     Pupils: Pupils are equal, round, and reactive to light.  Cardiovascular:     Rate and Rhythm: Normal rate and regular rhythm.     Heart sounds: Normal heart sounds.  Pulmonary:     Effort: Pulmonary effort is normal. No respiratory distress.     Breath sounds: Normal breath sounds.  Abdominal:     General: There is no distension.     Palpations: Abdomen is soft.     Tenderness: There is no abdominal tenderness.  Musculoskeletal:        General: No deformity. Normal range of motion.     Cervical back: Normal range of motion and neck supple.  Skin:    General: Skin is warm and dry.  Neurological:     General: No focal deficit present.     Mental Status: He is alert and oriented to person, place, and time.     ED Results / Procedures / Treatments   Labs (all labs ordered are listed, but only abnormal results are displayed)  Labs Reviewed - No data to display  EKG None  Radiology No results found.  Procedures Procedures    Medications Ordered in ED Medications  alum & mag hydroxide-simeth (MAALOX/MYLANTA) 200-200-20 MG/5ML suspension 30 mL (has no administration in time range)    And  lidocaine (XYLOCAINE) 2 % viscous mouth solution 15 mL (has no administration in time range)    ED Course/ Medical Decision Making/ A&P                                 Medical Decision Making Risk OTC drugs. Prescription drug management.    Medical Screen Complete  This patient presented to the ED with complaint of GERD symptoms.  This complaint involves an extensive number of treatment options. The initial differential diagnosis includes, but is not limited to, GERD  This presentation is: Acute, Chronic,  Self-Limited, Previously Undiagnosed, and Uncertain Prognosis  Patient with longstanding history of GERD.  He complains of worsening symptoms today.  Patient reports significant improvement with p.o. Maalox and p.o. lidocaine.  He is comfortable with plan for discharge home.  Patient is advised to use antiacid medications for control of his GERD symptoms.  Strict return precautions given and understood.  Portance of close follow-up stressed.    Additional history obtained:  External records from outside sources obtained and reviewed including prior ED visits and prior Inpatient records.    Problem List / ED Course:  GERD   Reevaluation:  After the interventions noted above, I reevaluated the patient and found that they have: resolved  Disposition:  After consideration of the diagnostic results and the patients response to treatment, I feel that the patent would benefit from close outpatient followup.          Final Clinical Impression(s) / ED Diagnoses Final diagnoses:  Gastroesophageal reflux disease, unspecified whether esophagitis present    Rx / DC Orders ED Discharge Orders     None         Wynetta Fines, MD 05/14/23 1955

## 2024-01-26 ENCOUNTER — Other Ambulatory Visit: Payer: Self-pay

## 2024-01-26 ENCOUNTER — Encounter (HOSPITAL_BASED_OUTPATIENT_CLINIC_OR_DEPARTMENT_OTHER): Payer: Self-pay

## 2024-01-26 ENCOUNTER — Emergency Department (HOSPITAL_BASED_OUTPATIENT_CLINIC_OR_DEPARTMENT_OTHER)
Admission: EM | Admit: 2024-01-26 | Discharge: 2024-01-27 | Disposition: A | Discharge status: Home / Self Care | Payer: Self-pay | Attending: Emergency Medicine | Admitting: Emergency Medicine

## 2024-01-26 ENCOUNTER — Emergency Department (HOSPITAL_BASED_OUTPATIENT_CLINIC_OR_DEPARTMENT_OTHER): Payer: Self-pay | Admitting: Radiology

## 2024-01-26 DIAGNOSIS — J45909 Unspecified asthma, uncomplicated: Secondary | ICD-10-CM | POA: Insufficient documentation

## 2024-01-26 DIAGNOSIS — R112 Nausea with vomiting, unspecified: Secondary | ICD-10-CM

## 2024-01-26 DIAGNOSIS — R1013 Epigastric pain: Secondary | ICD-10-CM | POA: Insufficient documentation

## 2024-01-26 DIAGNOSIS — R7401 Elevation of levels of liver transaminase levels: Secondary | ICD-10-CM | POA: Insufficient documentation

## 2024-01-26 DIAGNOSIS — K920 Hematemesis: Secondary | ICD-10-CM | POA: Insufficient documentation

## 2024-01-26 DIAGNOSIS — R824 Acetonuria: Secondary | ICD-10-CM | POA: Insufficient documentation

## 2024-01-26 LAB — COMPREHENSIVE METABOLIC PANEL WITH GFR
ALT: 17 U/L (ref 0–44)
AST: 42 U/L — ABNORMAL HIGH (ref 15–41)
Albumin: 4.9 g/dL (ref 3.5–5.0)
Alkaline Phosphatase: 81 U/L (ref 38–126)
Anion gap: 15 (ref 5–15)
BUN: 14 mg/dL (ref 6–20)
CO2: 25 mmol/L (ref 22–32)
Calcium: 10.1 mg/dL (ref 8.9–10.3)
Chloride: 103 mmol/L (ref 98–111)
Creatinine, Ser: 1.1 mg/dL (ref 0.61–1.24)
GFR, Estimated: >60 mL/min (ref >60)
Glucose, Bld: 115 mg/dL — ABNORMAL HIGH (ref 70–99)
Potassium: 3.6 mmol/L (ref 3.5–5.1)
Sodium: 143 mmol/L (ref 135–145)
Total Bilirubin: 1.1 mg/dL (ref 0.0–1.2)
Total Protein: 8 g/dL (ref 6.5–8.1)

## 2024-01-26 LAB — CBC WITH DIFFERENTIAL/PLATELET
Abs Immature Granulocytes: 0.03 K/uL (ref 0.00–0.07)
Basophils Absolute: 0.1 K/uL (ref 0.0–0.1)
Basophils Relative: 1 %
Eosinophils Absolute: 0 K/uL (ref 0.0–0.5)
Eosinophils Relative: 0 %
HCT: 41.3 % (ref 39.0–52.0)
Hemoglobin: 14 g/dL (ref 13.0–17.0)
Immature Granulocytes: 0 %
Lymphocytes Relative: 11 %
Lymphs Abs: 1 K/uL (ref 0.7–4.0)
MCH: 32 pg (ref 26.0–34.0)
MCHC: 33.9 g/dL (ref 30.0–36.0)
MCV: 94.3 fL (ref 80.0–100.0)
Monocytes Absolute: 0.5 K/uL (ref 0.1–1.0)
Monocytes Relative: 6 %
Neutro Abs: 7.4 K/uL (ref 1.7–7.7)
Neutrophils Relative %: 82 %
Platelets: 216 K/uL (ref 150–400)
RBC: 4.38 MIL/uL (ref 4.22–5.81)
RDW: 12.6 % (ref 11.5–15.5)
WBC: 9.1 K/uL (ref 4.0–10.5)
nRBC: 0 % (ref 0.0–0.2)

## 2024-01-26 LAB — URINALYSIS, ROUTINE W REFLEX MICROSCOPIC
Bilirubin Urine: NEGATIVE
Glucose, UA: NEGATIVE mg/dL
Hgb urine dipstick: NEGATIVE
Ketones, ur: 15 mg/dL — AB
Leukocytes,Ua: NEGATIVE
Nitrite: NEGATIVE
Protein, ur: NEGATIVE mg/dL
Specific Gravity, Urine: 1.024 (ref 1.005–1.030)
pH: 6.5 (ref 5.0–8.0)

## 2024-01-26 LAB — LIPASE, BLOOD: Lipase: 23 U/L (ref 11–51)

## 2024-01-26 MED ORDER — HYDROXYZINE HCL 25 MG PO TABS
50.0000 mg | ORAL_TABLET | Freq: Once | ORAL | Status: AC
  Administered 2024-01-26: 50 mg via ORAL
  Filled 2024-01-26: qty 2

## 2024-01-26 MED ORDER — LACTATED RINGERS IV BOLUS
1000.0000 mL | Freq: Once | INTRAVENOUS | Status: AC
  Administered 2024-01-26: 1000 mL via INTRAVENOUS

## 2024-01-26 MED ORDER — PANTOPRAZOLE SODIUM 40 MG PO PACK: 40.0000 mg | PACK | Freq: Every day | ORAL | 0 refills | Status: AC

## 2024-01-26 MED ORDER — MIDAZOLAM HCL 2 MG/2ML IJ SOLN: 0.5000 mg | Freq: Once | INTRAMUSCULAR | Status: DC

## 2024-01-26 MED ORDER — METOCLOPRAMIDE HCL 5 MG/ML IJ SOLN
10.0000 mg | Freq: Once | INTRAMUSCULAR | Status: AC
  Administered 2024-01-26: 10 mg via INTRAVENOUS
  Filled 2024-01-26: qty 2

## 2024-01-26 NOTE — ED Triage Notes (Addendum)
 Pt states he drank a lot last night and has had vomiting and diarrhea all day. 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 Does have a hx GERD

## 2024-01-26 NOTE — Discharge Instructions (Addendum)
 Was seen today for nausea and vomiting with blood noted in your vomit as well as with some belly pain today.  Your lab work and imaging today were really reassuring I have low suspicion for any emergent cause of your symptoms today.  However with your symptoms being that they are, recommend you continue to follow-up with GI for further evaluation as well as follow-up with PCP as you will need to be evaluated for why you are having some blood in your vomit every now and then.  Please watch your stool and if you begin to have any black or tarry stools return to the ED for further evaluation.  Or if you need to have uncontrollable belly pain.  Otherwise continue to take this proton pump inhibitor which should help lower the acidity of your stomach and help with your symptoms.

## 2024-01-26 NOTE — ED Provider Notes (Signed)
 EKGs are not crossing over here tonight.  Patient's EKG done for some acute chest pain.  Heart rate 85 sinus rhythm.  No acute findings   Geraldene Hamilton, MD 01/26/24 2246
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
## 2024-01-27 MED ORDER — ONDANSETRON 4 MG PO TBDP: 4.0000 mg | ORAL_TABLET | Freq: Three times a day (TID) | ORAL | 0 refills | Status: AC | PRN
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
## 2024-05-19 ENCOUNTER — Telehealth: Payer: Self-pay | Admitting: Internal Medicine

## 2024-05-19 NOTE — Telephone Encounter (Signed)
 Inbound call from patient stating that he Is scheduled to see Colleen on 1/2 at 8:30. He states that he is having issues swallowing and has been choking. He states when he eats or lays down he feels like he is going to die. Patient states that he needs to be seen sooner and is requesting a call back to discuss. Please advise.

## 2024-05-19 NOTE — Telephone Encounter (Signed)
 Pt having issues with gerd, regurgitation and a feeling of a lump in his throat. States he has not been taking his meds and does drink alcohol. He has been chewing tums and pepcid  but they do not seem to be helping. Discussed with pt he could try omeprazole  or lansoprazole otc prior to his OV. Pt scheduled to see Dr. Abran 05/21/24 at 10:20am. Pt aware of appt.

## 2024-05-21 ENCOUNTER — Ambulatory Visit: Payer: Self-pay | Admitting: Internal Medicine

## 2024-05-21 ENCOUNTER — Encounter: Payer: Self-pay | Admitting: Internal Medicine

## 2024-05-21 VITALS — BP 110/70 | HR 73 | Ht 68.0 in | Wt 143.0 lb

## 2024-05-21 DIAGNOSIS — R1310 Dysphagia, unspecified: Secondary | ICD-10-CM

## 2024-05-21 DIAGNOSIS — K219 Gastro-esophageal reflux disease without esophagitis: Secondary | ICD-10-CM

## 2024-05-21 DIAGNOSIS — F411 Generalized anxiety disorder: Secondary | ICD-10-CM | POA: Diagnosis not present

## 2024-05-21 MED ORDER — PANTOPRAZOLE SODIUM 40 MG PO TBEC: 40.0000 mg | DELAYED_RELEASE_TABLET | Freq: Two times a day (BID) | ORAL | 3 refills | Status: AC

## 2024-05-21 NOTE — Patient Instructions (Signed)
 We have sent the following medications to your pharmacy for you to pick up at your convenience:  Pantoprazole  - twice a day.  You have been scheduled for an endoscopy. Please follow written instructions given to you at your visit today.  If you use inhalers (even only as needed), please bring them with you on the day of your procedure.  If you take any of the following medications, they will need to be adjusted prior to your procedure:   DO NOT TAKE 7 DAYS PRIOR TO TEST- Trulicity (dulaglutide) Ozempic, Wegovy (semaglutide) Mounjaro, Zepbound (tirzepatide) Bydureon Bcise (exanatide extended release)  DO NOT TAKE 1 DAY PRIOR TO YOUR TEST Rybelsus (semaglutide) Adlyxin (lixisenatide) Victoza (liraglutide) Byetta (exanatide) ___________________________________________________________________________  _______________________________________________________  If your blood pressure at your visit was 140/90 or greater, please contact your primary care physician to follow up on this.  _______________________________________________________  If you are age 30 or older, your body mass index should be between 23-30. Your Body mass index is 21.74 kg/m. If this is out of the aforementioned range listed, please consider follow up with your Primary Care Provider.  If you are age 30 or younger, your body mass index should be between 19-25. Your Body mass index is 21.74 kg/m. If this is out of the aformentioned range listed, please consider follow up with your Primary Care Provider.   ________________________________________________________  The [place] [facility] providers would like to encourage you to use MYCHART to communicate with providers for non-urgent requests or questions.  Due to long hold times on the telephone, sending your provider a message by [facility] may be a faster and more efficient way to get a response.  Please allow 48 business hours for a response.  Please remember that this is for  non-urgent requests.  _______________________________________________________  [facility] is using a team-based approach to care.  Your team is made up of your doctor and two to three APPS. Our APPS (Nurse Practitioners and Physician Assistants) work with your physician to ensure care continuity for you. They are fully qualified to address your health concerns and develop a treatment plan. They communicate directly with your gastroenterologist to care for you. Seeing the Advanced Practice Practitioners on your physician's team can help you by facilitating care more promptly, often allowing for earlier appointments, access to diagnostic testing, procedures, and other specialty referrals. \

## 2024-05-21 NOTE — Progress Notes (Signed)
 HISTORY OF PRESENT ILLNESS:  Thomas Burton is a 28 y.o. male, currently selling used cars, with a history of GERD complicated by esophagitis and esophageal stricturing with prior esophageal dilation (most recently January 25, 2022) and anxiety disorder.  Patient had been on PPI therapy and doing well.  At some point he came off of his PPI therapy.  He tells me that several weeks ago he developed severe reflux.  He describes this as severe burning.  For this, on May 12, 2024, he presented to the emergency room via EMS with multiple complaints.  He reported that he was unable to swallow pills.  Had severe burning.  Tried to induce vomiting for relief but could not.  Noted things felt like they were stuck in his throat.  Had chest pain.  Shortness of breath.  He was evaluated and discharged.  Told to call GI.  He did, and this appointment given.  Tells me that his primary provider put him on famotidine  and Mylanta.  This is helping some.  He still feels like he has swallowing difficulties.  He states that since the problem started he has lost weight.  He does report some vague upper abdominal discomfort and bloating.  GI review of systems otherwise negative.  REVIEW OF SYSTEMS:  All non-GI ROS negative except for sinus allergy, cough, sore throat, headaches  Past Medical History:  Diagnosis Date   ADHD (attention deficit hyperactivity disorder)    Allergy    Anxiety    Asthma    Exercise induced asthma   Bipolar 1 disorder (HCC)    Chest pain    MRSA (methicillin resistant Staphylococcus aureus)     Past Surgical History:  Procedure Laterality Date   ADENOIDECTOMY     INGUINAL HERNIA REPAIR Bilateral 05/1994   KNEE SURGERY Right 07/02/2008   MRSA; hospitalized for one week; [place]   TYMPANOSTOMY TUBE PLACEMENT Bilateral     Social History Thomas Burton  reports that he has never smoked. He has never been exposed to tobacco smoke. He has never used smokeless tobacco. He reports  current alcohol use. He reports that he does not use drugs.  family history includes Heart disease in his maternal grandfather; Hiatal hernia in his maternal grandmother; Hypertension in his mother; Other in his paternal grandfather; Ovarian cancer in his maternal grandmother.  Allergies  Allergen Reactions   Methylphenidate Hcl     Other reaction(s): Unknown   Abilify [Aripiprazole] Other (See Comments)    Choking on tongue   Ritalin [Methylphenidate]     Anger       PHYSICAL EXAMINATION: Vital signs: BP 110/70   Pulse 73   Ht 5' 8 (1.727 m)   Wt 143 lb (64.9 kg)   BMI 21.74 kg/m   Constitutional: Anxious but generally well-appearing, no acute distress Psychiatric: alert and oriented x3, cooperative.  Anxious Eyes: extraocular movements intact, anicteric, conjunctiva pink Mouth: oral pharynx moist, no lesions, poor dentition Neck: supple no lymphadenopathy Cardiovascular: heart regular rate and rhythm, no murmur Lungs: clear to auscultation bilaterally Abdomen: soft, nontender, nondistended, no obvious ascites, no peritoneal signs, normal bowel sounds, no organomegaly Rectal: Omitted Extremities: no clubbing, cyanosis, or lower extremity edema bilaterally Skin: no lesions on visible extremities Neuro: No focal deficits.  Cranial nerves intact  ASSESSMENT:  1.  GERD 2.  Dysphagia 3.  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  Reflux precautions 2.  Prescribe pantoprazole  40 mg  p.o. twice daily.  Told to place the small tablet in applesauce and swallow.  He agrees he should be able to complete that task. 3.  Schedule upper endoscopy with esophageal dilation.The nature of the procedure, as well as the risks, benefits, and alternatives were carefully and thoroughly reviewed with the patient. Ample time for discussion and questions allowed. The patient understood, was satisfied, and agreed to proceed. 4.  Ongoing general medical care  with PCP A total time of 45 minutes was spent preparing to see the patient, obtaining comprehensive history, performing medically appropriate physical examination, counseling and educating the patient regarding the above listed issues, ordering medication, ordering therapeutic endoscopic procedure, and documenting clinical information in the health record

## 2024-06-02 ENCOUNTER — Telehealth: Payer: Self-pay

## 2024-06-02 NOTE — Telephone Encounter (Signed)
 My advice is to have the endoscopy as soon as possible.  I cannot control insurance issues.  In the interim he should be fine with soft foods and liquids.  Consume slowly

## 2024-06-02 NOTE — Telephone Encounter (Signed)
 He is currently scheduled for 06/29/24 - do you want to try to squeeze him in somewhere sooner or see if another provider can do it sooner?  He was very concerned so I wasn't sure if 12/29 was too long a wait

## 2024-06-02 NOTE — Telephone Encounter (Signed)
 Patient's insurance would not approve his EGD scheduled for tomorrow stating they had a 15 day turnaround from the time they received records from us .  This was scheduled on 11/20 so it appears to be only slightly less than 15 days but they will not cooperate.  I called patient and rescheduled him to 12/29 but he is concerned because he is still losing weight.  Is not able to take Pantoprazole , even in applesauce.  I told him I would reach out to you for any advice.

## 2024-06-02 NOTE — Telephone Encounter (Signed)
 He should have his procedure done with me. If there is an opening at some point after the insurance issue is resolved, he is welcome to that spot. You should keep an eye on the schedule

## 2024-06-03 ENCOUNTER — Other Ambulatory Visit (HOSPITAL_COMMUNITY): Payer: Self-pay

## 2024-06-03 ENCOUNTER — Encounter: Admitting: Internal Medicine

## 2024-06-03 NOTE — Telephone Encounter (Signed)
 Yes, some PPIs have capsule form that you can open up with granules inside.  These are often taken with something like applesauce. Work this out with the pharmacy.  The actual PPI does not matter, as long as it is 40 mg daily

## 2024-06-03 NOTE — Telephone Encounter (Signed)
 Have called several pharmacies including [facility] but cannot find the granules.  I was able to find a Prevacid solutab that will dissolve under his tongue but the strength was 30mg .  Will that work?

## 2024-06-03 NOTE — Telephone Encounter (Signed)
 Spoke with patient and relayed your suggestions.  Patient is still unable to get Protonix  down - wanted to know if I could send in the granules.  Please advise

## 2024-06-03 NOTE — Telephone Encounter (Signed)
 Perfect. Thanks.

## 2024-06-04 MED ORDER — LANSOPRAZOLE 30 MG PO TBDD: 30.0000 mg | DELAYED_RELEASE_TABLET | Freq: Every day | ORAL | 3 refills | Status: DC

## 2024-06-04 NOTE — Telephone Encounter (Signed)
 Spoke to patient and made sure he was ok with taking the Prevacid Solutabs.  He was fine with it so I sent them to his pharmacy.  He will take them daily and call if he has any trouble.

## 2024-06-16 NOTE — Telephone Encounter (Signed)
 PT is calling with an update on the new medications. Please advise.

## 2024-06-16 NOTE — Telephone Encounter (Signed)
 Patient called with an update - the Prevacid  solutabs have helped with regurgitation, seems to be having more burping, but he is still unable to swallow very little.  He is down another 10 lbs and reports he is even having trouble swallowing liquids.  You have not had any cancellations for a sooner appointment (currently scheduled for 12/29).  I told him I would check to see if you had any further suggestions.  Please advise

## 2024-06-17 MED ORDER — LANSOPRAZOLE 30 MG PO TBDD: 30.0000 mg | DELAYED_RELEASE_TABLET | Freq: Two times a day (BID) | ORAL | 3 refills | Status: AC

## 2024-06-17 NOTE — Telephone Encounter (Signed)
 See his PCP regarding anxiety disorder. I believe that anxiety is playing role in his issues. Can increase solutab to bid, if not already

## 2024-06-17 NOTE — Telephone Encounter (Signed)
 Patient called requesting to speak with Thomas Burton. Patient is requesting a call back. Please advise.

## 2024-06-17 NOTE — Addendum Note (Signed)
 Addended by: Darryon Bastin K on: 06/17/2024 02:52 PM   Modules accepted: Orders

## 2024-06-17 NOTE — Telephone Encounter (Signed)
 Spoke with patient and relayed Dr. Nancyann response - increased Prevacid  solutabs to twice a day, told him I would monitor the schedule for cancellations.  We discussed talking to his pcp regarding his anxiety, which he admitted was a large factor - he has anxiety about choking.  I will call patient if there are any cancellations.  Patient agreed.

## 2024-06-29 ENCOUNTER — Encounter: Payer: Self-pay | Admitting: Internal Medicine

## 2024-06-29 ENCOUNTER — Ambulatory Visit (AMBULATORY_SURGERY_CENTER): Admitting: Internal Medicine

## 2024-06-29 VITALS — BP 116/68 | HR 81 | Temp 99.8°F | Resp 14 | Ht 68.0 in | Wt 143.0 lb

## 2024-06-29 DIAGNOSIS — R1310 Dysphagia, unspecified: Secondary | ICD-10-CM

## 2024-06-29 DIAGNOSIS — K222 Esophageal obstruction: Secondary | ICD-10-CM

## 2024-06-29 DIAGNOSIS — K449 Diaphragmatic hernia without obstruction or gangrene: Secondary | ICD-10-CM

## 2024-06-29 DIAGNOSIS — K219 Gastro-esophageal reflux disease without esophagitis: Secondary | ICD-10-CM

## 2024-06-29 MED ORDER — SODIUM CHLORIDE 0.9 % IV SOLN: 500.0000 mL | Freq: Once | INTRAVENOUS | Status: AC

## 2024-06-29 NOTE — Patient Instructions (Signed)
 YOU HAD AN ENDOSCOPIC PROCEDURE TODAY AT THE [facility]:   Refer to the procedure report that was given to you for any specific questions about what was found during the examination.  If the procedure report does not answer your questions, please call your gastroenterologist to clarify.  If you requested that your care partner not be given the details of your procedure findings, then the procedure report has been included in a sealed envelope for you to review at your convenience later.  YOU SHOULD EXPECT: Some feelings of bloating in the abdomen. Passage of more gas than usual.  Walking can help get rid of the air that was put into your GI tract during the procedure and reduce the bloating. If you had a lower endoscopy (such as a colonoscopy or flexible sigmoidoscopy) you may notice spotting of blood in your stool or on the toilet paper. If you underwent a bowel prep for your procedure, you may not have a normal bowel movement for a few days.  Please Note:  You might notice some irritation and congestion in your nose or some drainage.  This is from the oxygen  used during your procedure.  There is no need for concern and it should clear up in a day or so.  SYMPTOMS TO REPORT IMMEDIATELY:   Following upper endoscopy (EGD)  Vomiting of blood or coffee ground material  New chest pain or pain under the shoulder blades  Painful or persistently difficult swallowing  New shortness of breath  Fever of 100F or higher  Black, tarry-looking stools  Post dilation diet Continue present medications Continue lansoprazole  - 30 mg solutabs twice daily.  Take first thing in the morning and again 30 minutes before your evening meal Chew food well at all times Return to the care of your primary provider.  GI follow up as needed    For urgent or emergent issues, a gastroenterologist can be reached at any hour by calling [phone]. Do not use MyChart messaging for urgent concerns.    DIET:   We do recommend a small meal at first, but then you may proceed to your regular diet.  Drink plenty of fluids but you should avoid alcoholic beverages for 24 hours.  ACTIVITY:  You should plan to take it easy for the rest of today and you should NOT DRIVE or use heavy machinery until tomorrow (because of the sedation medicines used during the test).    FOLLOW UP: Our staff will call the number listed on your records the next business day following your procedure.  We will call around 7:15- 8:00 am to check on you and address any questions or concerns that you may have regarding the information given to you following your procedure. If we do not reach you, we will leave a message.     If any biopsies were taken you will be contacted by phone or by letter within the next 1-3 weeks.  Please call us  at [phone] if you have not heard about the biopsies in 3 weeks.    SIGNATURES/CONFIDENTIALITY: You and/or your care partner have signed paperwork which will be entered into your electronic medical record.  These signatures attest to the fact that that the information above on your After Visit Summary has been reviewed and is understood.  Full responsibility of the confidentiality of this discharge information lies with you and/or your care-partner.

## 2024-06-29 NOTE — Op Note (Signed)
  [facility] Patient Name: Thomas Burton Procedure Date: 06/29/2024 3:08 PM MRN: [id number] Endoscopist: Norleen SAILOR. Abran , MD, [id number] Age: 28 Referring MD:  Date of Birth: 11/11/1995 Gender: Male Account #: [id number] Procedure:                Upper GI endoscopy with dilation of the esophagus.                            18 to 20 mm max Indications:              Dysphagia, Therapeutic procedure, Esophageal reflux Medicines:                Monitored Anesthesia Care Procedure:                Pre-Anesthesia Assessment:                           - Prior to the procedure, a History and Physical                            was performed, and patient medications and                            allergies were reviewed. The patient's tolerance of                            previous anesthesia was also reviewed. The risks                            and benefits of the procedure and the sedation                            options and risks were discussed with the patient.                            All questions were answered, and informed consent                            was obtained. Prior Anticoagulants: The patient has                            taken no anticoagulant or antiplatelet agents. ASA                            Grade Assessment: II - A patient with mild systemic                            disease. After reviewing the risks and benefits,                            the patient was deemed in satisfactory condition to                            undergo the procedure.  After obtaining informed consent, the endoscope was                            passed under direct vision. Throughout the                            procedure, the patient's blood pressure, pulse, and                            oxygen  saturations were monitored continuously. The                            Olympus scope [id number] was introduced through the                            mouth,  and advanced to the second part of duodenum.                            The upper GI endoscopy was accomplished without                            difficulty. The patient tolerated the procedure                            well. Scope In: Scope Out: Findings:                 One benign-appearing, intrinsic moderate stenosis                            was found 40 cm from the incisors. This was large                            caliber. After completing the endoscopic survey, A                            TTS dilator was passed through the scope. Dilation                            with an 18-19-20 mm balloon dilator was performed                            to 20 mm. Balloon was pulled through the entire                            esophagus for entire esophageal dilation. No                            mucosal trauma.                           The exam of the esophagus was otherwise normal.                           The stomach  was normal. Small hiatal hernia.                           The examined duodenum was normal.                           The cardia and gastric fundus were normal on                            retroflexion. Complications:            No immediate complications. Estimated Blood Loss:     Estimated blood loss: none. Impression:               - Benign-appearing esophageal stenosis. Dilated.                           - Normal stomach.                           - Normal examined duodenum.                           - No specimens collected. Recommendation:           1. Patient has a contact number available for                            emergencies. The signs and symptoms of potential                            delayed complications were discussed with the                            patient. Return to normal activities tomorrow.                            Written discharge instructions were provided to the                            patient.                           2. Post  dilation diet.                           3. Continue present medications.                           4. Continue lansoprazole  30 mg SoluTab's twice                            daily. Take first thing in the morning and again 30                            minutes before your evening meal.  5. Chew food well at all times                           6. Return to the care of your primary provider. GI                            follow-up as needed Norleen SAILOR. Abran, MD 06/29/2024 3:40:18 PM This report has been signed electronically.

## 2024-06-29 NOTE — Progress Notes (Signed)
 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  Patient had been on PPI therapy and doing well.  At some point he came off of his PPI therapy.  He tells me that several weeks ago he developed severe reflux.  He describes this as severe burning.  For this, on May 12, 2024, he presented to the emergency room via EMS with multiple complaints.  He reported that he was unable to swallow pills.  Had severe burning.  Tried to induce vomiting for relief but could not.  Noted things felt like they were stuck in his throat.  Had chest pain.  Shortness of breath.  He was evaluated and discharged.  Told to call GI.  He did, and this appointment given.   Tells me that his primary provider put him on famotidine  and Mylanta.  This is helping some.  He still feels like he has swallowing difficulties.  He states that since the problem started he has lost weight.  He does report some vague upper abdominal discomfort and bloating.  GI review of systems otherwise negative.   REVIEW OF SYSTEMS:   All non-GI ROS negative except for sinus allergy, cough, sore throat, headaches       Past Medical History:  Diagnosis Date   ADHD (attention deficit hyperactivity disorder)     Allergy     Anxiety     Asthma      Exercise induced asthma   Bipolar 1 disorder (HCC)     Chest pain     MRSA (methicillin resistant Staphylococcus aureus)                 Past Surgical History:  Procedure Laterality Date   ADENOIDECTOMY       INGUINAL HERNIA REPAIR Bilateral 05/1994   KNEE SURGERY Right 07/02/2008    MRSA; hospitalized for one week; [place]   TYMPANOSTOMY TUBE PLACEMENT Bilateral            Social History Thomas Burton  reports that he has never smoked. He has never been exposed  to tobacco smoke. He has never used smokeless tobacco. He reports current alcohol use. He reports that he does not use drugs.   family history includes Heart disease in his maternal grandfather; Hiatal hernia in his maternal grandmother; Hypertension in his mother; Other in his paternal grandfather; Ovarian cancer in his maternal grandmother.   Allergies       Allergies  Allergen Reactions   Methylphenidate Hcl        Other reaction(s): Unknown   Abilify [Aripiprazole] Other (See Comments)      Choking on tongue   Ritalin [Methylphenidate]        Anger            PHYSICAL EXAMINATION: Vital signs: BP 110/70   Pulse 73   Ht 5' 8 (1.727 m)   Wt 143 lb (64.9 kg)   BMI 21.74 kg/m   Constitutional: Anxious but generally well-appearing, no acute distress Psychiatric: alert and oriented x3, cooperative.  Anxious Eyes: extraocular movements intact, anicteric, conjunctiva pink Mouth: oral pharynx moist, no lesions, poor dentition Neck: supple no lymphadenopathy Cardiovascular: heart regular rate and rhythm, no murmur Lungs: clear to auscultation bilaterally Abdomen: soft, nontender, nondistended, no obvious ascites, no peritoneal signs,  normal bowel sounds, no organomegaly Rectal: Omitted Extremities: no clubbing, cyanosis, or lower extremity edema bilaterally Skin: no lesions on visible extremities Neuro: No focal deficits.  Cranial nerves intact   ASSESSMENT:   1.  GERD 2.  Dysphagia 3.  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  Reflux precautions 2.  Prescribe pantoprazole  40 mg p.o. twice daily.  Told to place the small tablet in applesauce and swallow.  He agrees he should be able to complete that task. 3.  Schedule upper endoscopy with esophageal dilation.The nature of the procedure, as well as the risks, benefits, and alternatives were carefully and thoroughly reviewed with the patient. Ample time for discussion and questions  allowed. The patient understood, was satisfied, and agreed to proceed. 4.  Ongoing general medical care with PCP    Recent HPI as above.  No interval change.  Now for upper endoscopy with esophageal dilation

## 2024-06-29 NOTE — Progress Notes (Signed)
 VS by EJ

## 2024-06-29 NOTE — Progress Notes (Signed)
 Called to room to assist during endoscopic procedure.  Patient ID and intended procedure confirmed with present staff. Received instructions for my participation in the procedure from the performing physician.

## 2024-06-29 NOTE — Progress Notes (Signed)
 Vss nad trans to pacu

## 2024-06-30 ENCOUNTER — Telehealth: Payer: Self-pay

## 2024-06-30 NOTE — Telephone Encounter (Signed)
" °  Follow up Call-     06/29/2024    3:10 PM 01/25/2022    1:17 PM  Call back number  Post procedure Call Back phone  # [phone]  Permission to leave phone message Yes Yes     Patient questions:  Do you have a fever, pain , or abdominal swelling? No. Pain Score  0 *  Have you tolerated food without any problems? Yes.    Have you been able to return to your normal activities? Yes.    Do you have any questions about your discharge instructions: Diet   No. Medications  No. Follow up visit  No.  Do you have questions or concerns about your Care? No.  Actions: * If pain score is 4 or above: No action needed, pain <4.   "

## 2024-07-02 NOTE — Progress Notes (Deleted)
" ° ° ° °  07/02/2024 JACOBE STUDY [id number] [birth year]   Chief Complaint:  History of Present Illness:   He underwent an EGD by Dr. Abran 06/29/2024: - Benign-appearing esophageal stenosis. Dilated. - Normal stomach. - Normal examined duodenum. - No specimens collected. Impression: 1. Patient has a contact number available for emergencies. The signs and symptoms of potential delayed complications were discussed with the patient. Return to normal activities tomorrow. Written discharge instructions were provided to the patient. 2. Post dilation diet. 3. Continue present medications. 4. Continue lansoprazole  30 mg SoluTab's twice daily. Take first thing in the morning and again 30 minutes before your evening meal. 5. Chew food well at all times 6. Return to the care of your primary provider. GI follow-up as needed    Discussed the use of AI scribe software for clinical note transcription with the patient, who gave verbal consent to proceed.  History of Present Illness     Current Medications, Allergies, Past Medical History, Past Surgical History, Family History and Social History were reviewed in [employer] record.   Review of Systems:   Constitutional: Negative for fever, sweats, chills or weight loss.  Respiratory: Negative for shortness of breath.   Cardiovascular: Negative for chest pain, palpitations and leg swelling.  Gastrointestinal: See HPI.  Musculoskeletal: Negative for back pain or muscle aches.  Neurological: Negative for dizziness, headaches or paresthesias.    Physical Exam: There were no vitals taken for this visit. General: in no acute distress. Head: Normocephalic and atraumatic. Eyes: No scleral icterus. Conjunctiva pink . Ears: Normal auditory acuity. Mouth: Dentition intact. No ulcers or lesions.  Lungs: Clear throughout to auscultation. Heart: Regular rate and rhythm, no murmur. Abdomen: Soft, nontender and nondistended. No masses or  hepatomegaly. Normal bowel sounds x 4 quadrants.  Rectal: Deferred.  Musculoskeletal: Symmetrical with no gross deformities. Extremities: No edema. Neurological: Alert oriented x 4. No focal deficits.  Psychological: Alert and cooperative. Normal mood and affect  Assessment and Recommendations: ***  "

## 2024-07-03 ENCOUNTER — Ambulatory Visit: Payer: Self-pay | Admitting: Nurse Practitioner
